# Patient Record
Sex: Male | Born: 1965 | Race: Black or African American | Hispanic: No | Smoking: Former smoker
Health system: Southern US, Community
[De-identification: ages and names within clinical notes are randomized; demographics above are authoritative.]

## PROBLEM LIST (undated history)

## (undated) DIAGNOSIS — I493 Ventricular premature depolarization: Secondary | ICD-10-CM

## (undated) DIAGNOSIS — B009 Herpesviral infection, unspecified: Secondary | ICD-10-CM

## (undated) DIAGNOSIS — F32A Depression, unspecified: Secondary | ICD-10-CM

## (undated) DIAGNOSIS — F419 Anxiety disorder, unspecified: Secondary | ICD-10-CM

## (undated) DIAGNOSIS — N529 Male erectile dysfunction, unspecified: Secondary | ICD-10-CM

## (undated) DIAGNOSIS — K219 Gastro-esophageal reflux disease without esophagitis: Secondary | ICD-10-CM

## (undated) DIAGNOSIS — F329 Major depressive disorder, single episode, unspecified: Secondary | ICD-10-CM

## (undated) DIAGNOSIS — R0683 Snoring: Secondary | ICD-10-CM

## (undated) HISTORY — DX: Depression, unspecified: F32.A

## (undated) HISTORY — PX: OTHER SURGICAL HISTORY: SHX169

## (undated) HISTORY — DX: Ventricular premature depolarization: I49.3

## (undated) HISTORY — DX: Male erectile dysfunction, unspecified: N52.9

## (undated) HISTORY — DX: Herpesviral infection, unspecified: B00.9

## (undated) HISTORY — DX: Gastro-esophageal reflux disease without esophagitis: K21.9

## (undated) HISTORY — DX: Snoring: R06.83

## (undated) HISTORY — DX: Anxiety disorder, unspecified: F41.9

## (undated) HISTORY — DX: Major depressive disorder, single episode, unspecified: F32.9

---

## 2001-09-13 ENCOUNTER — Encounter: Payer: Self-pay | Admitting: *Deleted

## 2001-09-13 ENCOUNTER — Emergency Department (HOSPITAL_COMMUNITY): Admission: EM | Admit: 2001-09-13 | Discharge: 2001-09-13 | Payer: Self-pay | Admitting: *Deleted

## 2001-11-28 ENCOUNTER — Ambulatory Visit (HOSPITAL_COMMUNITY): Admission: RE | Admit: 2001-11-28 | Discharge: 2001-11-28 | Payer: Self-pay | Admitting: Unknown Physician Specialty

## 2001-11-28 ENCOUNTER — Encounter: Payer: Self-pay | Admitting: Unknown Physician Specialty

## 2004-04-12 HISTORY — PX: PARTIAL COLECTOMY: SHX5273

## 2004-11-16 ENCOUNTER — Encounter: Admission: RE | Admit: 2004-11-16 | Discharge: 2004-11-16 | Payer: Self-pay

## 2004-11-17 ENCOUNTER — Inpatient Hospital Stay (HOSPITAL_COMMUNITY): Admission: EM | Admit: 2004-11-17 | Discharge: 2004-11-20 | Payer: Self-pay | Admitting: Emergency Medicine

## 2004-11-18 ENCOUNTER — Ambulatory Visit: Payer: Self-pay | Admitting: Cardiology

## 2005-01-27 ENCOUNTER — Encounter: Admission: RE | Admit: 2005-01-27 | Discharge: 2005-01-27 | Payer: Self-pay | Admitting: General Surgery

## 2007-01-19 DIAGNOSIS — N400 Enlarged prostate without lower urinary tract symptoms: Secondary | ICD-10-CM | POA: Insufficient documentation

## 2007-01-19 DIAGNOSIS — K5730 Diverticulosis of large intestine without perforation or abscess without bleeding: Secondary | ICD-10-CM | POA: Insufficient documentation

## 2007-01-19 DIAGNOSIS — Z8249 Family history of ischemic heart disease and other diseases of the circulatory system: Secondary | ICD-10-CM | POA: Insufficient documentation

## 2007-01-19 DIAGNOSIS — F419 Anxiety disorder, unspecified: Secondary | ICD-10-CM | POA: Insufficient documentation

## 2007-01-19 DIAGNOSIS — N529 Male erectile dysfunction, unspecified: Secondary | ICD-10-CM | POA: Insufficient documentation

## 2007-02-21 DIAGNOSIS — Z7251 High risk heterosexual behavior: Secondary | ICD-10-CM | POA: Insufficient documentation

## 2010-10-30 ENCOUNTER — Emergency Department: Payer: Self-pay | Admitting: Emergency Medicine

## 2013-12-16 ENCOUNTER — Emergency Department: Payer: Self-pay | Admitting: Emergency Medicine

## 2013-12-16 LAB — URINALYSIS, COMPLETE
BILIRUBIN, UR: NEGATIVE
Bacteria: NONE SEEN
Blood: NEGATIVE
Glucose,UR: NEGATIVE mg/dL (ref 0–75)
KETONE: NEGATIVE
Leukocyte Esterase: NEGATIVE
Nitrite: NEGATIVE
PH: 5 (ref 4.5–8.0)
Protein: NEGATIVE
RBC,UR: 3 /HPF (ref 0–5)
Specific Gravity: 1.023 (ref 1.003–1.030)

## 2013-12-16 LAB — CBC WITH DIFFERENTIAL/PLATELET
BASOS PCT: 0.4 %
Basophil #: 0 10*3/uL (ref 0.0–0.1)
EOS ABS: 0 10*3/uL (ref 0.0–0.7)
Eosinophil %: 0.3 %
HCT: 48.4 % (ref 40.0–52.0)
HGB: 15.2 g/dL (ref 13.0–18.0)
Lymphocyte #: 1.9 10*3/uL (ref 1.0–3.6)
Lymphocyte %: 16.7 %
MCH: 25.4 pg — AB (ref 26.0–34.0)
MCHC: 31.4 g/dL — ABNORMAL LOW (ref 32.0–36.0)
MCV: 81 fL (ref 80–100)
MONOS PCT: 11 %
Monocyte #: 1.3 x10 3/mm — ABNORMAL HIGH (ref 0.2–1.0)
NEUTROS ABS: 8.2 10*3/uL — AB (ref 1.4–6.5)
NEUTROS PCT: 71.6 %
PLATELETS: 159 10*3/uL (ref 150–440)
RBC: 5.98 10*6/uL — AB (ref 4.40–5.90)
RDW: 14.4 % (ref 11.5–14.5)
WBC: 11.5 10*3/uL — ABNORMAL HIGH (ref 3.8–10.6)

## 2013-12-16 LAB — COMPREHENSIVE METABOLIC PANEL
ALK PHOS: 76 U/L
AST: 23 U/L (ref 15–37)
Albumin: 3.9 g/dL (ref 3.4–5.0)
Anion Gap: 8 (ref 7–16)
BUN: 9 mg/dL (ref 7–18)
Bilirubin,Total: 0.9 mg/dL (ref 0.2–1.0)
CALCIUM: 8.8 mg/dL (ref 8.5–10.1)
CREATININE: 0.98 mg/dL (ref 0.60–1.30)
Chloride: 103 mmol/L (ref 98–107)
Co2: 26 mmol/L (ref 21–32)
EGFR (African American): >60
GLUCOSE: 125 mg/dL — AB (ref 65–99)
OSMOLALITY: 274 (ref 275–301)
POTASSIUM: 3.8 mmol/L (ref 3.5–5.1)
SGPT (ALT): 38 U/L
SODIUM: 137 mmol/L (ref 136–145)
TOTAL PROTEIN: 8.2 g/dL (ref 6.4–8.2)

## 2014-10-24 ENCOUNTER — Other Ambulatory Visit: Payer: Self-pay | Admitting: Family Medicine

## 2014-10-24 DIAGNOSIS — K219 Gastro-esophageal reflux disease without esophagitis: Secondary | ICD-10-CM

## 2014-10-24 DIAGNOSIS — B009 Herpesviral infection, unspecified: Secondary | ICD-10-CM | POA: Insufficient documentation

## 2014-10-24 NOTE — Telephone Encounter (Signed)
LAST CPE 03/06/13

## 2015-02-11 ENCOUNTER — Other Ambulatory Visit: Payer: Self-pay | Admitting: Family Medicine

## 2015-04-29 ENCOUNTER — Ambulatory Visit (INDEPENDENT_AMBULATORY_CARE_PROVIDER_SITE_OTHER): Payer: BLUE CROSS/BLUE SHIELD | Admitting: Family Medicine

## 2015-04-29 ENCOUNTER — Encounter: Payer: Self-pay | Admitting: Family Medicine

## 2015-04-29 VITALS — BP 102/62 | HR 88 | Temp 98.5°F | Resp 16 | Ht 73.5 in | Wt 214.0 lb

## 2015-04-29 DIAGNOSIS — B009 Herpesviral infection, unspecified: Secondary | ICD-10-CM | POA: Diagnosis not present

## 2015-04-29 DIAGNOSIS — K219 Gastro-esophageal reflux disease without esophagitis: Secondary | ICD-10-CM | POA: Insufficient documentation

## 2015-04-29 DIAGNOSIS — M7741 Metatarsalgia, right foot: Secondary | ICD-10-CM

## 2015-04-29 DIAGNOSIS — F32A Depression, unspecified: Secondary | ICD-10-CM | POA: Insufficient documentation

## 2015-04-29 DIAGNOSIS — R0683 Snoring: Secondary | ICD-10-CM | POA: Insufficient documentation

## 2015-04-29 DIAGNOSIS — I493 Ventricular premature depolarization: Secondary | ICD-10-CM | POA: Insufficient documentation

## 2015-04-29 DIAGNOSIS — F329 Major depressive disorder, single episode, unspecified: Secondary | ICD-10-CM | POA: Insufficient documentation

## 2015-04-29 MED ORDER — VALACYCLOVIR HCL 500 MG PO TABS: ORAL_TABLET | ORAL | Status: DC

## 2015-04-29 NOTE — Patient Instructions (Signed)
We will call you with the podiatry referral. 

## 2015-04-29 NOTE — Progress Notes (Signed)
Subjective:     Patient ID: RAMIL EDGINGTON, male   DOB: 05/13/1965, 50 y.o.   MRN: [id number]  HPI  Chief Complaint  Patient presents with  . Foot Pain  Originally scheduled for a physical but patient states he solely wishes evaluation of right foot pain  x 4 weeks with weight bearing. Localizes pain to fourth metatarsal. 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 He wears shoe inserts and has changed his foot wear on several occasions. Also wishes refill on Valtrex 500 mg. for HSV suppression. States he may only get one breakthrough eruption over the course of the year.    Review of Systems     Objective:   Physical Exam  Constitutional: He appears well-developed and well-nourished. No distress.  Musculoskeletal:  Right DF/PF 5/5. Ankle ligaments stable. Tender over midshaft fourth metatarsal.       Assessment:    1. Uncomplicated herpes simplex - valACYclovir (VALTREX) 500 MG tablet; One tablet daily  Dispense: 90 tablet; Refill: 3  2. Metatarsalgia, right - Ambulatory referral to Podiatry    Plan:    We will call him with referral information. Have printed out rx today as he is not sure of mail order pharmacy due to insurance change.

## 2015-06-07 IMAGING — CT CT ABD-PELV W/ CM
2 of 5 series · 16 of 46 positions shown, 18 images · IV contrast (isovue)
Comparison: 10/30/2010

CLINICAL DATA: Left lower quadrant pain for 2 days. History of
diverticulitis.

EXAM:
CT ABDOMEN AND PELVIS WITH CONTRAST
TECHNIQUE: Multidetector CT imaging of the abdomen and pelvis was performed
using the standard protocol following bolus administration of
intravenous contrast.
CONTRAST:  100 mL Isovue 370

[Series 2: routine abd pel with · axial · 0.71mm/px · z∈[-333,+132]mm · 13 of 105 slices shown, 15 images]
[im 6/105  soft-tissue]
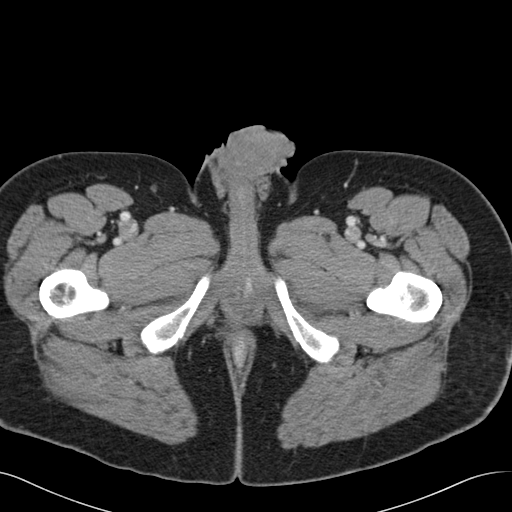
[im 6/105  bone]
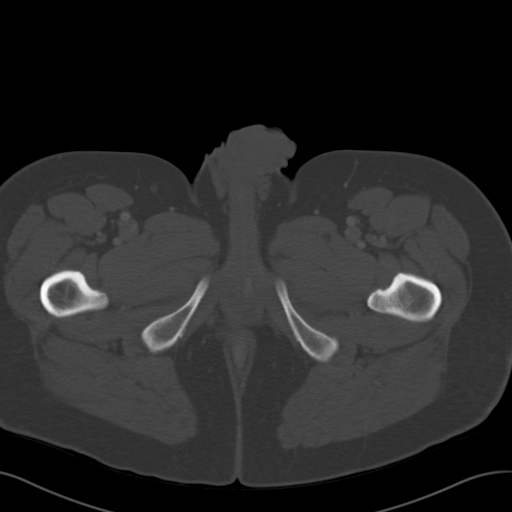
[im 16/105  soft-tissue]
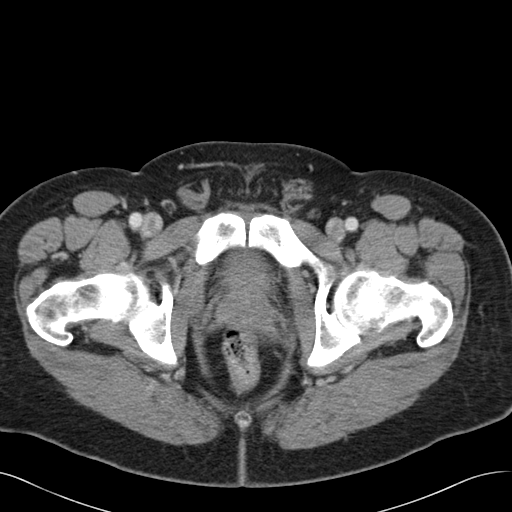
[im 21/105  soft-tissue]
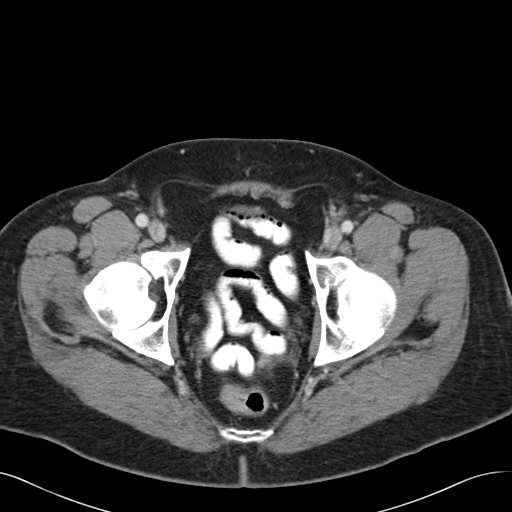
[im 32/105  soft-tissue]
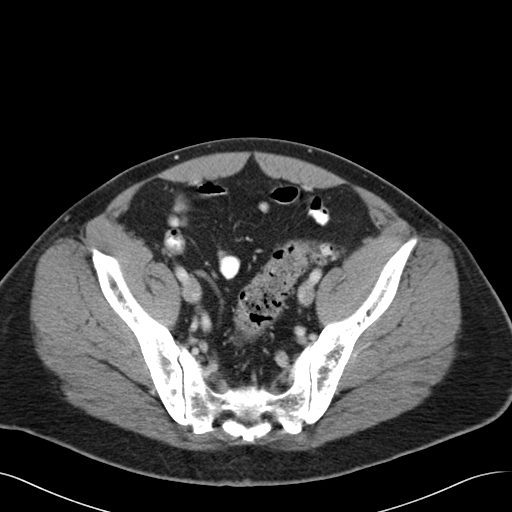
[im 37/105  soft-tissue]
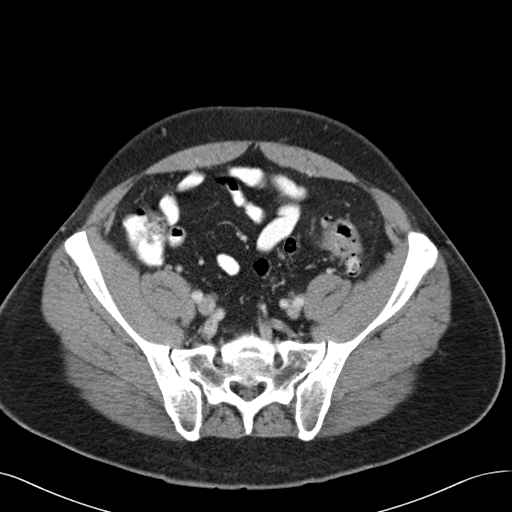
[im 47/105  soft-tissue]
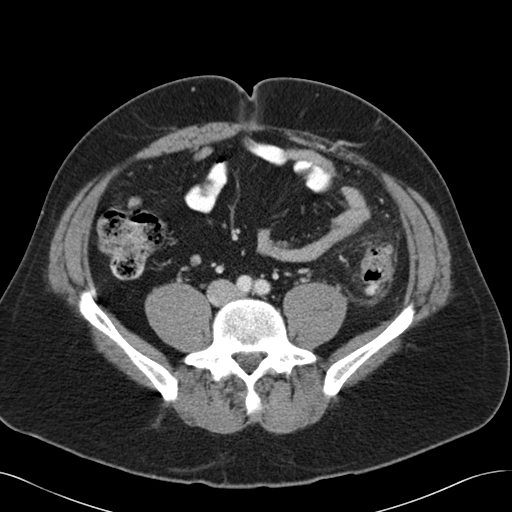
[im 53/105  soft-tissue]
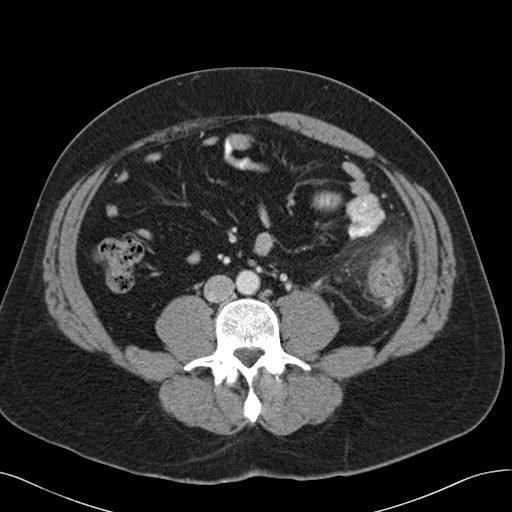
[im 58/105  soft-tissue]
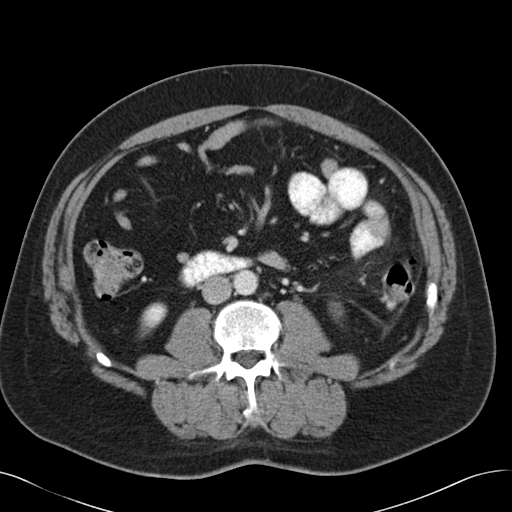
[im 68/105  soft-tissue]
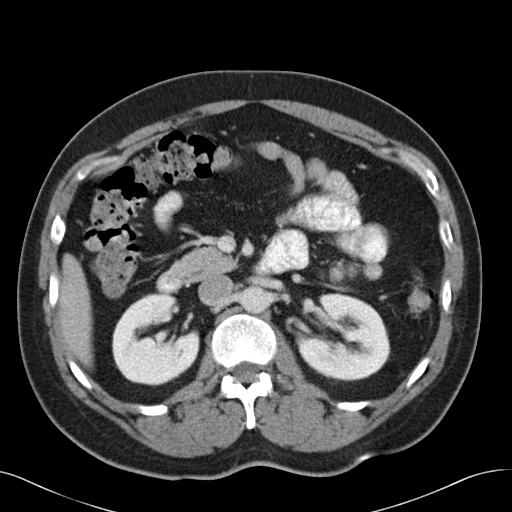
[im 68/105  bone]
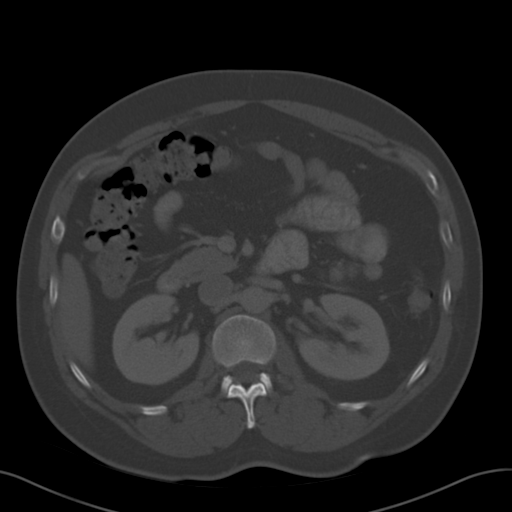
[im 73/105  soft-tissue]
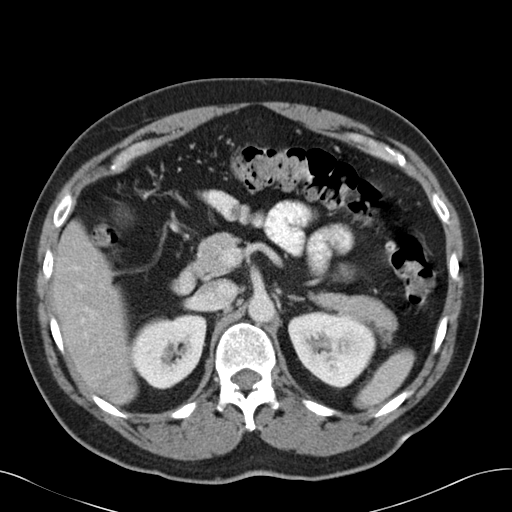
[im 84/105  soft-tissue]
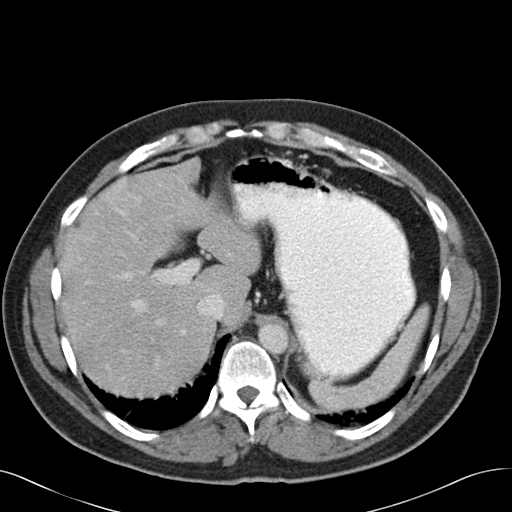
[im 89/105  soft-tissue]
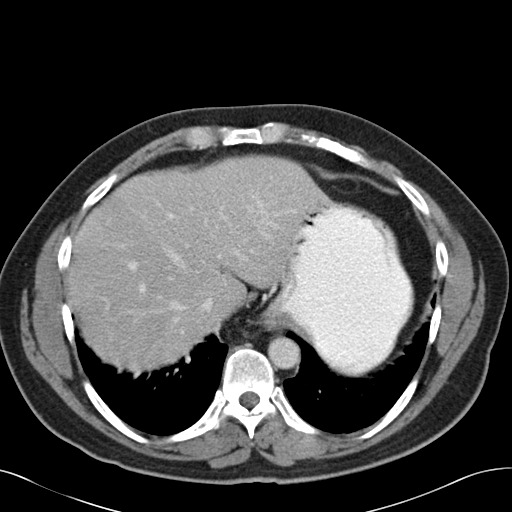
[im 99/105  soft-tissue]
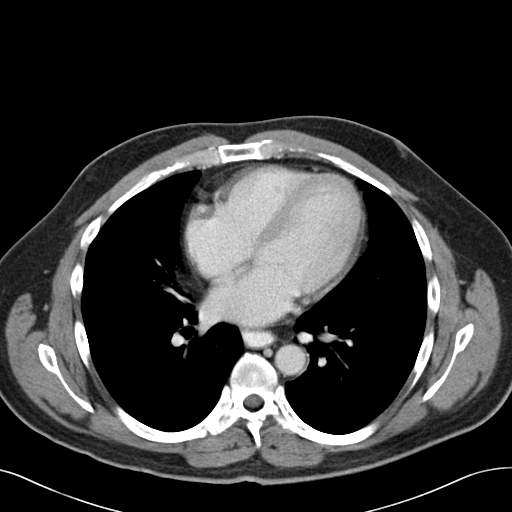

[Series 6: cor routine abd pel with · coronal · 0.71mm/px · 3 of 144 slices shown]
[im 48/144  soft-tissue]
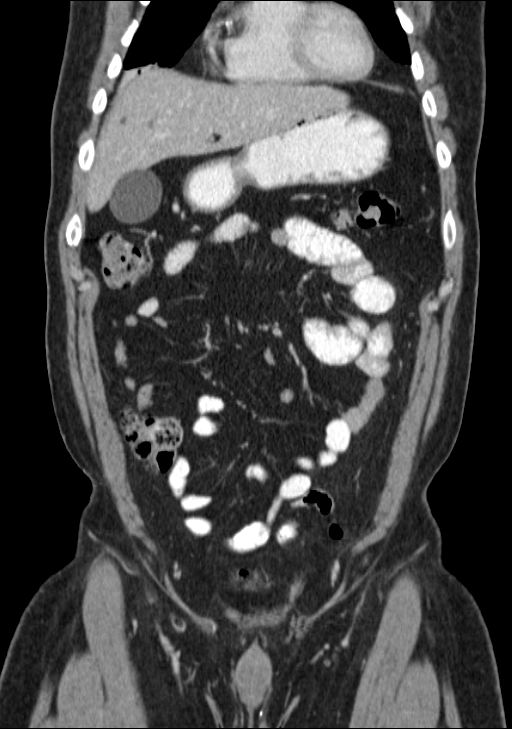
[im 64/144  soft-tissue]
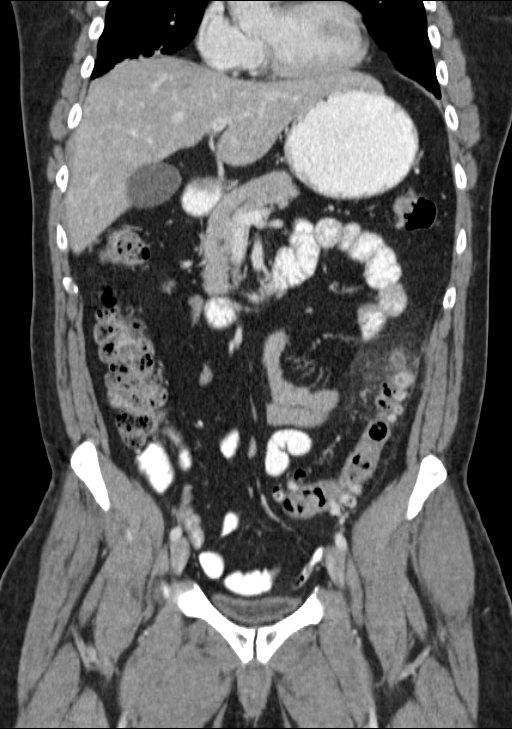
[im 80/144  soft-tissue]
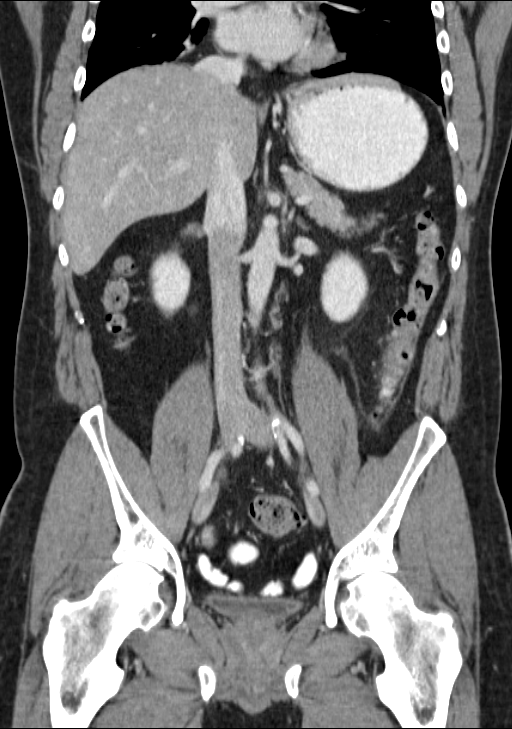

[16 of 46 positions shown; findings below may reference images not displayed]

FINDINGS: Linear atelectasis in the lung bases. Small esophageal hiatal
hernia. Residual contrast material in the lower esophagus may
indicate reflux or dysmotility.

Mild diffuse fatty infiltration of the liver. Scattered
circumscribed sub cm low-attenuation lesions, likely cysts.
Gallbladder, pancreas, spleen, adrenal glands, kidneys, abdominal
aorta, inferior vena cava, and retroperitoneal lymph nodes are
unremarkable. Stomach and small bowel are normal for degree of
distention. No free air or free fluid in the abdomen.

Pelvis: Diverticulosis of the descending and sigmoid colon.
Pericolonic infiltration around the mid descending colon consistent
with acute diverticulitis. No abscess. No free or loculated pelvic
fluid collections. Bladder is decompressed. Appendix is normal. No
destructive bone lesions.
IMPRESSION: Colonic diverticulosis with inflammatory changes in the mid
descending colon consistent with acute diverticulitis. No abscess.

## 2015-07-25 ENCOUNTER — Other Ambulatory Visit: Payer: Self-pay | Admitting: Family Medicine

## 2015-07-25 DIAGNOSIS — B009 Herpesviral infection, unspecified: Secondary | ICD-10-CM

## 2015-07-25 MED ORDER — VALACYCLOVIR HCL 500 MG PO TABS: ORAL_TABLET | ORAL | Status: DC

## 2017-02-18 ENCOUNTER — Encounter: Payer: Self-pay | Admitting: Family Medicine

## 2017-02-18 ENCOUNTER — Ambulatory Visit: Payer: 59 | Admitting: Family Medicine

## 2017-02-18 VITALS — BP 122/82 | HR 74 | Temp 98.7°F | Resp 16 | Ht 73.5 in | Wt 202.6 lb

## 2017-02-18 DIAGNOSIS — K219 Gastro-esophageal reflux disease without esophagitis: Secondary | ICD-10-CM

## 2017-02-18 DIAGNOSIS — B009 Herpesviral infection, unspecified: Secondary | ICD-10-CM | POA: Diagnosis not present

## 2017-02-18 MED ORDER — VALACYCLOVIR HCL 500 MG PO TABS: ORAL_TABLET | ORAL | 3 refills | Status: DC

## 2017-02-18 MED ORDER — VALACYCLOVIR HCL 500 MG PO TABS: ORAL_TABLET | ORAL | 0 refills | Status: DC

## 2017-02-18 MED ORDER — PANTOPRAZOLE SODIUM 40 MG PO TBEC: 40.0000 mg | DELAYED_RELEASE_TABLET | Freq: Every day | ORAL | 1 refills | Status: DC

## 2017-02-18 NOTE — Progress Notes (Signed)
Subjective:     Patient ID: Alexander Greer, male   DOB: 18-Nov-1965, 51 y.o.   MRN: [id number]  HPI  Chief Complaint  Patient presents with  . Gastroesophageal Reflux    Patient returns to office today for follow up visit, patient was last seen 02/08/13 and started on Protonix 40mg . Patient reports good compliance and symptom control on medication.   Marland Kitchen. Herpes Zoster    Patient request refill today for Valtrex, he states that he is currently having a outbreak.   States is currently have HSV outbreak but understands it is too late for acute treatment. Will refill his medication for prophylaxis purposes. Reports intermittent use of Protonix for flares of heartburn. 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 Defers health maintenance.   Review of Systems  Gastrointestinal:       Due for repeat colonoscopy. Will call for referral back to [facility]. 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       Objective:   Physical Exam  Constitutional: He appears well-developed and well-nourished. No distress.  Psychiatric: He has a normal mood and affect. His behavior is normal.       Assessment:    1. Uncomplicated herpes simplex: also sent rx for #90 with 3 refills to [facility]. - valACYclovir (VALTREX) 500 MG tablet; One tablet daily  Dispense: 30 tablet; Refill: 0  2. Gastroesophageal reflux disease without esophagitis: rx for Protonix 40 mg. #30 with refill for prn use. Plan:    He will call for G.I. Referral once established in his new job. Discussed H.O.B.elevation.

## 2017-02-18 NOTE — Patient Instructions (Signed)
Do call for colonoscopy referral when your schedule is established in your new job.

## 2017-03-08 ENCOUNTER — Telehealth: Payer: Self-pay | Admitting: Family Medicine

## 2017-03-08 ENCOUNTER — Other Ambulatory Visit: Payer: Self-pay | Admitting: Family Medicine

## 2017-03-08 MED ORDER — PANTOPRAZOLE SODIUM 40 MG PO TBEC: 40.0000 mg | DELAYED_RELEASE_TABLET | Freq: Every day | ORAL | 0 refills | Status: DC

## 2017-03-08 NOTE — Telephone Encounter (Signed)
[facility] faxed a request for the following medication. Thanks CC  pantoprazole (PROTONIX) 40 MG tablet

## 2017-03-08 NOTE — Telephone Encounter (Signed)
done

## 2017-03-08 NOTE — Telephone Encounter (Signed)
Please review

## 2017-03-09 ENCOUNTER — Telehealth: Payer: Self-pay | Admitting: Family Medicine

## 2017-03-09 NOTE — Telephone Encounter (Signed)
Pt contacted office for refill request on the following medications:  Rec'd a 2nd request.  pantoprazole (PROTONIX) 40 MG tablet   [facility] mail order/MW

## 2017-03-09 NOTE — Telephone Encounter (Signed)
Please see script on your desk. KW

## 2017-03-10 ENCOUNTER — Other Ambulatory Visit: Payer: Self-pay | Admitting: Family Medicine

## 2017-03-10 MED ORDER — PANTOPRAZOLE SODIUM 40 MG PO TBEC: 40.0000 mg | DELAYED_RELEASE_TABLET | Freq: Every day | ORAL | 0 refills | Status: DC

## 2017-03-10 NOTE — Telephone Encounter (Signed)
done

## 2017-05-06 ENCOUNTER — Other Ambulatory Visit: Payer: Self-pay | Admitting: Family Medicine

## 2017-09-01 ENCOUNTER — Other Ambulatory Visit: Payer: Self-pay | Admitting: Family Medicine

## 2017-09-01 ENCOUNTER — Telehealth: Payer: Self-pay | Admitting: Family Medicine

## 2017-09-01 DIAGNOSIS — B009 Herpesviral infection, unspecified: Secondary | ICD-10-CM

## 2017-09-01 MED ORDER — VALACYCLOVIR HCL 500 MG PO TABS: ORAL_TABLET | ORAL | 3 refills | Status: DC

## 2017-09-01 NOTE — Telephone Encounter (Signed)
Rx request for Valtrex 500 mg LOV: 02/18/2017 Last RF: 02/18/2017 No new OV at this time

## 2017-09-01 NOTE — Telephone Encounter (Signed)
done

## 2017-09-01 NOTE — Telephone Encounter (Signed)
[facility] faxed a refill request for the following medication. Thanks CC  valACYclovir (VALTREX) 500 MG tablet

## 2018-02-07 DIAGNOSIS — B360 Pityriasis versicolor: Secondary | ICD-10-CM | POA: Insufficient documentation

## 2018-03-20 LAB — HM COLONOSCOPY

## 2019-06-20 ENCOUNTER — Other Ambulatory Visit: Payer: Self-pay | Admitting: Adult Health

## 2019-06-20 ENCOUNTER — Other Ambulatory Visit: Payer: Self-pay

## 2019-06-20 ENCOUNTER — Encounter: Payer: Self-pay | Admitting: Adult Health

## 2019-06-20 ENCOUNTER — Ambulatory Visit (INDEPENDENT_AMBULATORY_CARE_PROVIDER_SITE_OTHER): Payer: 59 | Admitting: Adult Health

## 2019-06-20 VITALS — BP 118/82 | HR 82 | Temp 97.4°F | Resp 16 | Ht 73.0 in | Wt 205.2 lb

## 2019-06-20 DIAGNOSIS — K5730 Diverticulosis of large intestine without perforation or abscess without bleeding: Secondary | ICD-10-CM | POA: Diagnosis not present

## 2019-06-20 DIAGNOSIS — Z8719 Personal history of other diseases of the digestive system: Secondary | ICD-10-CM

## 2019-06-20 DIAGNOSIS — B009 Herpesviral infection, unspecified: Secondary | ICD-10-CM | POA: Diagnosis not present

## 2019-06-20 DIAGNOSIS — Z13220 Encounter for screening for lipoid disorders: Secondary | ICD-10-CM

## 2019-06-20 DIAGNOSIS — N529 Male erectile dysfunction, unspecified: Secondary | ICD-10-CM

## 2019-06-20 DIAGNOSIS — Z1211 Encounter for screening for malignant neoplasm of colon: Secondary | ICD-10-CM

## 2019-06-20 DIAGNOSIS — Z125 Encounter for screening for malignant neoplasm of prostate: Secondary | ICD-10-CM

## 2019-06-20 DIAGNOSIS — Z0000 Encounter for general adult medical examination without abnormal findings: Secondary | ICD-10-CM | POA: Diagnosis not present

## 2019-06-20 DIAGNOSIS — Z113 Encounter for screening for infections with a predominantly sexual mode of transmission: Secondary | ICD-10-CM

## 2019-06-20 DIAGNOSIS — Z1329 Encounter for screening for other suspected endocrine disorder: Secondary | ICD-10-CM

## 2019-06-20 DIAGNOSIS — K1321 Leukoplakia of oral mucosa, including tongue: Secondary | ICD-10-CM

## 2019-06-20 DIAGNOSIS — K219 Gastro-esophageal reflux disease without esophagitis: Secondary | ICD-10-CM | POA: Diagnosis not present

## 2019-06-20 DIAGNOSIS — E559 Vitamin D deficiency, unspecified: Secondary | ICD-10-CM

## 2019-06-20 MED ORDER — VALACYCLOVIR HCL 1 G PO TABS: 1000.0000 mg | ORAL_TABLET | Freq: Every day | ORAL | 0 refills | Status: DC

## 2019-06-20 MED ORDER — TADALAFIL 5 MG PO TABS: 5.0000 mg | ORAL_TABLET | ORAL | 0 refills | Status: DC | PRN

## 2019-06-20 MED ORDER — PANTOPRAZOLE SODIUM 40 MG PO TBEC: 40.0000 mg | DELAYED_RELEASE_TABLET | Freq: Every day | ORAL | 0 refills | Status: DC

## 2019-06-20 NOTE — Progress Notes (Addendum)
Patient: Alexander Greer Male    DOB: [birth year]   54 y.o.   MRN: [id number] Visit Date: 06/20/2019  Today's Provider: Jairo Ben, FNP   Chief Complaint  Patient presents with  . Establish Care   Subjective:     HPI Patient returns back to office today to establish care, patient reports that he is feeling well today and will need his current prescriptions refilled. He needs refill Valtrex.  He was a current patient of PA Anola Gurney - last seen in office on 02/18/2017.  He had herpes zoster at  age 39 was his first outbreak.he reports this occurred on his lower back. He has had Valtrex on hand in case he needed and he reports it is now expired and he requests a refill.  He has no current outbreaks. Last outbreak was 6 months ago. He had fever blister as a teenager and none since. Denies any genital or rectal  outbreaks.    He has some erectile dysfunction, he reports two years ago since his divorce started and he has 12 more months to go. He is able to get an erection at times and then is unable to maintain it for longer than 3 to 5 minutes. He denies any pain or lesions of his genitals. He reports using Viagra in past and getting a headache. He would like to try Cialis, symptoms and side effects were  discussed.   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 He has not taken an antidepressant.   He uses alcohol. He does not smoke tobacco. He smokes weed occasionally.   Denies any cardiac disease, myocardial infarctions or shortness of breath.   He reports he has had a " white tongue " for years and it never clears up. Will send to ENT for evaluation given time frame. He has used multiple prescription antifungals in liquid.   He also needs Protonix refilled for his GERD. He follows with Dr. Norma Fredrickson in gastroenterology at [facility].  Colonoscopy 2006. I do see colonoscopy in 2019 but message shows it was canceled and no results are available.  Provider  is unable to view any results. He has a history f GERD as above and also has history of colon diverticulitis in past. He denies any worsening issues and reports GERD is controlled by diet and medication. He drinks less now than he did when he was going through his divorce. He reports social drinking occasionally in the evenings. Denies any oral, rectal bleeding. Denies any abdominal pain.   Patient  denies any fever, body aches,chills, rash, chest pain, shortness of breath, nausea, vomiting, or diarrhea.     Allergies  Allergen Reactions  . Penicillins      Current Outpatient Medications:  .  Aloe Vera (ALOE VERA MOISTURIZING) 99.5 % GEL, Take by mouth., Disp: , Rfl:  .  pantoprazole (PROTONIX) 40 MG tablet, TAKE 1 TABLET BY MOUTH  DAILY AS NEEDED FOR ACID  REFLUX/HEARTBURN (Patient not taking: Reported on 06/20/2019), Disp: 90 tablet, Rfl: 0 .  valACYclovir (VALTREX) 500 MG tablet, One tablet daily (Patient not taking: Reported on 06/20/2019), Disp: 90 tablet, Rfl: 3  Review of Systems  Constitutional: Positive for fatigue. Negative for activity change, appetite change, chills, diaphoresis, fever and unexpected weight change.  HENT: Negative.   Respiratory: Negative.  Negative for apnea, cough, choking, chest tightness, shortness of breath, wheezing and stridor.   Cardiovascular: Negative.  Negative for chest pain,  palpitations and leg swelling.  Gastrointestinal: Negative.        GERD controlled. He reports regular bowel movements. Denies bleeding.   Genitourinary: Negative for decreased urine volume, difficulty urinating, discharge, dysuria, enuresis, flank pain, frequency, genital sores, hematuria, penile pain, penile swelling, scrotal swelling, testicular pain and urgency.       He reports difficulty maintaining an erection since his divorce. He has a new partner.   Musculoskeletal: Negative.   Skin: Negative.        Denies any rash or Zoster. Denies any skin lesions or change in skin.    Allergic/Immunologic: Positive for environmental allergies.  Neurological: Negative.   Psychiatric/Behavioral: Negative for agitation, behavioral problems, confusion, decreased concentration, dysphoric mood, hallucinations, self-injury, sleep disturbance and suicidal ideas. The patient is nervous/anxious. The patient is not hyperactive.   All other systems reviewed and are negative.   Social History   Tobacco Use  . Smoking status: Former Smoker    Types: Cigarettes  . Smokeless tobacco: Never Used  . Tobacco comment: Quit smoking in his 9s  Substance Use Topics  . Alcohol use: Yes    Alcohol/week: 6.0 standard drinks    Types: 6 Cans of beer per week      Objective:     Blood pressure 118/82, pulse 82, temperature (!) 97.4 F (36.3 C), temperature source Temporal, resp. rate 16, height 6\' 1"  (1.854 m), weight 205 lb 3.2 oz (93.1 kg), SpO2 98 %. Body mass index is 27.07 kg/m.   Physical Exam Vitals reviewed.  Constitutional:      General: He is not in acute distress.    Appearance: Normal appearance. He is not ill-appearing, toxic-appearing or diaphoretic.  HENT:     Head: Normocephalic and atraumatic.     Right Ear: Tympanic membrane, ear canal and external ear normal. There is no impacted cerumen.     Left Ear: Tympanic membrane, ear canal and external ear normal. There is no impacted cerumen.     Nose: Nose normal. No congestion or rhinorrhea.     Mouth/Throat:     Mouth: Mucous membranes are moist.     Pharynx: No oropharyngeal exudate or posterior oropharyngeal erythema.  Eyes:     General: No scleral icterus.       Right eye: No discharge.        Left eye: No discharge.     Extraocular Movements: Extraocular movements intact.     Conjunctiva/sclera: Conjunctivae normal.     Pupils: Pupils are equal, round, and reactive to light.  Neck:     Vascular: No carotid bruit.  Cardiovascular:     Rate and Rhythm: Normal rate and regular rhythm.     Pulses: Normal  pulses.     Heart sounds: Normal heart sounds. No murmur. No friction rub. No gallop.   Pulmonary:     Effort: Pulmonary effort is normal. No respiratory distress.     Breath sounds: Normal breath sounds. No stridor. No wheezing, rhonchi or rales.  Chest:     Chest wall: No tenderness.  Abdominal:     General: Abdomen is flat. Bowel sounds are normal. There is no distension.     Palpations: Abdomen is soft. There is no mass.     Tenderness: There is no abdominal tenderness. There is no right CVA tenderness, left CVA tenderness, guarding or rebound.     Hernia: No hernia is present.  Genitourinary:    Comments: Patient declined, rectal and genital exam.  Musculoskeletal:  General: No swelling, tenderness, deformity or signs of injury.     Cervical back: Normal range of motion and neck supple. No rigidity or tenderness.     Right lower leg: No edema.     Left lower leg: No edema.  Lymphadenopathy:     Cervical: No cervical adenopathy.  Skin:    General: Skin is warm and dry.     Capillary Refill: Capillary refill takes less than 2 seconds.     Coloration: Skin is not jaundiced or pale.     Findings: No bruising, erythema, lesion or rash.  Neurological:     General: No focal deficit present.     Mental Status: He is alert and oriented to person, place, and time.     Cranial Nerves: No cranial nerve deficit.     Sensory: No sensory deficit.     Motor: No weakness.     Coordination: Coordination normal.     Gait: Gait normal.     Deep Tendon Reflexes: Reflexes normal.  Psychiatric:        Mood and Affect: Mood normal.        Behavior: Behavior normal.        Thought Content: Thought content normal.        Judgment: Judgment normal.    Depression screen PHQ 2/9 06/20/2019  Decreased Interest 1  Down, Depressed, Hopeless 1  PHQ - 2 Score 2  Altered sleeping 1  Tired, decreased energy 1  Change in appetite 0  Feeling bad or failure about yourself  1  Trouble  concentrating 2  Moving slowly or fidgety/restless 0  Suicidal thoughts 0  PHQ-9 Score 7  Difficult doing work/chores Somewhat difficult       No results found for any visits on 06/20/19.     Assessment & Plan     Annual physical exam  ED (erectile dysfunction) of organic origin - Plan: CBC with Differential/Platelet, Comprehensive Metabolic Panel (CMET)  Gastroesophageal reflux disease, unspecified whether esophagitis present - Plan: Ambulatory referral to Gastroenterology  Uncomplicated herpes simplex - Plan: HSV(herpes simplex vrs) 1+2 ab-IgG, HIV antibody (with reflex)  Colon, diverticulosis - Plan: Ambulatory referral to Gastroenterology  Screening for thyroid disorder - Plan: TSH  Screening for cholesterol level - Plan: Lipid Panel w/o Chol/HDL Ratio  Screening for STD (sexually transmitted disease) - Plan: RPR, Hepatitis panel, acute  Vitamin D deficiency - Plan: VITAMIN D 25 Hydroxy (Vit-D Deficiency, Fractures)  Encounter for prostate cancer screening - Plan: PSA  Screening for malignant neoplasm of colon - Plan: Ambulatory referral to Gastroenterology  History of diverticulitis - Plan: Ambulatory referral to Gastroenterology  Leukoplakia of oral cavity - Plan: Ambulatory referral to ENT   Colonoscopy referral can not see results or year. Needs follow up for chronic diverticulitis history as well as GERD.     Meds ordered this encounter  Medications  . tadalafil (CIALIS) 5 MG tablet    Sig: Take 1 tablet (5 mg total) by mouth every other day as needed for erectile dysfunction (inform all healthcare providers of use).    Dispense:  10 tablet    Refill:  0  . valACYclovir (VALTREX) 1000 MG tablet    Sig: Take 1 tablet (1,000 mg total) by mouth daily. Start at first sign of symptom onset. Use for 5 days.    Dispense:  20 tablet    Refill:  0  . pantoprazole (PROTONIX) 40 MG tablet    Sig: Take 1 tablet (40 mg  total) by mouth daily.    Dispense:  90  tablet    Refill:  0  Discussed medications and use as prescribed above. Discussed possible underlying etiologies - cardiovascular, urological for erectile dysfunction, patient declines further work up or concerning symptoms. He would like to try Cialis, discussed use as prescribed only and not to use more than once every 48 hours. Emergency of erection that does not resolve and seek care. Inform all providers you are on this medication, do not take with cardiac disease or with any nitrates.   An After Visit Summary was printed and given to the patient.Patient verbalized understanding of all instructions given and denies any further questions at this time.  Return in about 3 months (around 09/20/2019), or if symptoms worsen or fail to improve, for at any time for any worsening symptoms, Go to Emergency room/ urgent care if worse.    The entirety of the information documented in the History of Present Illness, Review of Systems and Physical Exam were personally obtained by me. Portions of this information were initially documented by the  Certified Medical Assistant whose name is documented in Epic and reviewed by me for thoroughness and accuracy.  I have personally performed the exam and reviewed the chart and it is accurate to the best of my knowledge.  Museum/gallery conservator has been used and any errors in dictation or transcription are unintentional.   Eula Fried. Flinchum FNP-C  [facility]   Jairo Ben, FNP  [facility]

## 2019-06-20 NOTE — Patient Instructions (Signed)
Valacyclovir caplets What is this medicine? VALACYCLOVIR (val ay SYE kloe veer) is an antiviral medicine. It is used to treat or prevent infections caused by certain kinds of viruses. Examples of these infections include herpes and shingles. This medicine will not cure herpes. This medicine may be used for other purposes; ask your health care provider or pharmacist if you have questions. COMMON BRAND NAME(S): Valtrex What should I tell my health care provider before I take this medicine? They need to know if you have any of these conditions:  acquired immunodeficiency syndrome (AIDS)  any other condition that may weaken the immune system  bone marrow or kidney transplant  kidney disease  an unusual or allergic reaction to valacyclovir, acyclovir, ganciclovir, valganciclovir, other medicines, foods, dyes, or preservatives  pregnant or trying to get pregnant  breast-feeding How should I use this medicine? Take this medicine by mouth with a glass of water. Follow the directions on the prescription label. You can take this medicine with or without food. Take your doses at regular intervals. Do not take your medicine more often than directed. Finish the full course prescribed by your doctor or health care professional even if you think your condition is better. Do not stop taking except on the advice of your doctor or health care professional. Talk to your pediatrician regarding the use of this medicine in children. While this drug may be prescribed for children as young as 2 years for selected conditions, precautions do apply. Overdosage: If you think you have taken too much of this medicine contact a poison control center or emergency room at once. NOTE: This medicine is only for you. Do not share this medicine with others. What if I miss a dose? If you miss a dose, take it as soon as you can. If it is almost time for your next dose, take only that dose. Do not take double or extra  doses. What may interact with this medicine? Do not take this medicine with any of the following medications:  cidofovir This medicine may also interact with the following medications:  adefovir  amphotericin B  certain antibiotics like amikacin, gentamicin, tobramycin, vancomycin  cimetidine  cisplatin  colistin  cyclosporine  foscarnet  lithium  methotrexate  probenecid  tacrolimus This list may not describe all possible interactions. Give your health care provider a list of all the medicines, herbs, non-prescription drugs, or dietary supplements you use. Also tell them if you smoke, drink alcohol, or use illegal drugs. Some items may interact with your medicine. What should I watch for while using this medicine? Tell your doctor or health care professional if your symptoms do not start to get better after 1 week. This medicine works best when taken early in the course of an infection, within the first 59 hours. Begin treatment as soon as possible after the first signs of infection like tingling, itching, or pain in the affected area. It is possible that genital herpes may still be spread even when you are not having symptoms. Always use safer sex practices like condoms made of latex or polyurethane whenever you have sexual contact. You should stay well hydrated while taking this medicine. Drink plenty of fluids. What side effects may I notice from receiving this medicine? Side effects that you should report to your doctor or health care professional as soon as possible:  allergic reactions like skin rash, itching or hives, swelling of the face, lips, or tongue  aggressive behavior  confusion  hallucinations  problems  with balance, talking, walking  stomach pain  tremor  trouble passing urine or change in the amount of urine Side effects that usually do not require medical attention (report to your doctor or health care professional if they continue or are  bothersome):  dizziness  headache  nausea, vomiting This list may not describe all possible side effects. Call your doctor for medical advice about side effects. You may report side effects to FDA at 1-800-FDA-1088. Where should I keep my medicine? Keep out of the reach of children. Store at room temperature between 15 and 25 degrees C (59 and 77 degrees F). Keep container tightly closed. Throw away any unused medicine after the expiration date. NOTE: This sheet is a summary. It may not cover all possible information. If you have questions about this medicine, talk to your doctor, pharmacist, or health care provider.  2020 Elsevier/Gold Standard (2018-04-25 12:22:33) Gastroesophageal Reflux Disease, Adult Gastroesophageal reflux (GER) happens when acid from the stomach flows up into the tube that connects the mouth and the stomach (esophagus). Normally, food travels down the esophagus and stays in the stomach to be digested. With GER, food and stomach acid sometimes move back up into the esophagus. You may have a disease called gastroesophageal reflux disease (GERD) if the reflux:  Happens often.  Causes frequent or very bad symptoms.  Causes problems such as damage to the esophagus. When this happens, the esophagus becomes sore and swollen (inflamed). Over time, GERD can make small holes (ulcers) in the lining of the esophagus. What are the causes? This condition is caused by a problem with the muscle between the esophagus and the stomach. When this muscle is weak or not normal, it does not close properly to keep food and acid from coming back up from the stomach. The muscle can be weak because of:  Tobacco use.  Pregnancy.  Having a certain type of hernia (hiatal hernia).  Alcohol use.  Certain foods and drinks, such as coffee, chocolate, onions, and peppermint. What increases the risk? You are more likely to develop this condition if you:  Are overweight.  Have a disease  that affects your connective tissue.  Use NSAID medicines. What are the signs or symptoms? Symptoms of this condition include:  Heartburn.  Difficult or painful swallowing.  The feeling of having a lump in the throat.  A bitter taste in the mouth.  Bad breath.  Having a lot of saliva.  Having an upset or bloated stomach.  Belching.  Chest pain. Different conditions can cause chest pain. Make sure you see your doctor if you have chest pain.  Shortness of breath or noisy breathing (wheezing).  Ongoing (chronic) cough or a cough at night.  Wearing away of the surface of teeth (tooth enamel).  Weight loss. How is this treated? Treatment will depend on how bad your symptoms are. Your doctor may suggest:  Changes to your diet.  Medicine.  Surgery. Follow these instructions at home: Eating and drinking   Follow a diet as told by your doctor. You may need to avoid foods and drinks such as: ? Coffee and tea (with or without caffeine). ? Drinks that contain alcohol. ? Energy drinks and sports drinks. ? Bubbly (carbonated) drinks or sodas. ? Chocolate and cocoa. ? Peppermint and mint flavorings. ? Garlic and onions. ? Horseradish. ? Spicy and acidic foods. These include peppers, chili powder, curry powder, vinegar, hot sauces, and BBQ sauce. ? Citrus fruit juices and citrus fruits, such as oranges,  lemons, and limes. ? Tomato-based foods. These include red sauce, chili, salsa, and pizza with red sauce. ? Fried and fatty foods. These include donuts, french fries, potato chips, and high-fat dressings. ? High-fat meats. These include hot dogs, rib eye steak, sausage, ham, and bacon. ? High-fat dairy items, such as whole milk, butter, and cream cheese.  Eat small meals often. Avoid eating large meals.  Avoid drinking large amounts of liquid with your meals.  Avoid eating meals during the 2-3 hours before bedtime.  Avoid lying down right after you eat.  Do not  exercise right after you eat. Lifestyle   Do not use any products that contain nicotine or tobacco. These include cigarettes, e-cigarettes, and chewing tobacco. If you need help quitting, ask your doctor.  Try to lower your stress. If you need help doing this, ask your doctor.  If you are overweight, lose an amount of weight that is healthy for you. Ask your doctor about a safe weight loss goal. General instructions  Pay attention to any changes in your symptoms.  Take over-the-counter and prescription medicines only as told by your doctor. Do not take aspirin, ibuprofen, or other NSAIDs unless your doctor says it is okay.  Wear loose clothes. Do not wear anything tight around your waist.  Raise (elevate) the head of your bed about 6 inches (15 cm).  Avoid bending over if this makes your symptoms worse.  Keep all follow-up visits as told by your doctor. This is important. Contact a doctor if:  You have new symptoms.  You lose weight and you do not know why.  You have trouble swallowing or it hurts to swallow.  You have wheezing or a cough that keeps happening.  Your symptoms do not get better with treatment.  You have a hoarse voice. Get help right away if:  You have pain in your arms, neck, jaw, teeth, or back.  You feel sweaty, dizzy, or light-headed.  You have chest pain or shortness of breath.  You throw up (vomit) and your throw-up looks like blood or coffee grounds.  You pass out (faint).  Your poop (stool) is bloody or black.  You cannot swallow, drink, or eat. Summary  If a person has gastroesophageal reflux disease (GERD), food and stomach acid move back up into the esophagus and cause symptoms or problems such as damage to the esophagus.  Treatment will depend on how bad your symptoms are.  Follow a diet as told by your doctor.  Take all medicines only as told by your doctor. This information is not intended to replace advice given to you by your  health care provider. Make sure you discuss any questions you have with your health care provider. Document Revised: 10/05/2017 Document Reviewed: 10/05/2017 Elsevier Patient Education  2020 [employer]. Food Choices for Gastroesophageal Reflux Disease, Adult When you have gastroesophageal reflux disease (GERD), the foods you eat and your eating habits are very important. Choosing the right foods can help ease your discomfort. Think about working with a nutrition specialist (dietitian) to help you make good choices. What are tips for following this plan?  Meals  Choose healthy foods that are low in fat, such as fruits, vegetables, whole grains, low-fat dairy products, and lean meat, fish, and poultry.  Eat small meals often instead of 3 large meals a day. Eat your meals slowly, and in a place where you are relaxed. Avoid bending over or lying down until 2-3 hours after eating.  Avoid eating  meals 2-3 hours before bed.  Avoid drinking a lot of liquid with meals.  Cook foods using methods other than frying. Bake, grill, or broil food instead.  Avoid or limit: ? Chocolate. ? Peppermint or spearmint. ? Alcohol. ? Pepper. ? Black and decaffeinated coffee. ? Black and decaffeinated tea. ? Bubbly (carbonated) soft drinks. ? Caffeinated energy drinks and soft drinks.  Limit high-fat foods such as: ? Fatty meat or fried foods. ? Whole milk, cream, butter, or ice cream. ? Nuts and nut butters. ? Pastries, donuts, and sweets made with butter or shortening.  Avoid foods that cause symptoms. These foods may be different for everyone. Common foods that cause symptoms include: ? Tomatoes. ? Oranges, lemons, and limes. ? Peppers. ? Spicy food. ? Onions and garlic. ? Vinegar. Lifestyle  Maintain a healthy weight. Ask your doctor what weight is healthy for you. If you need to lose weight, work with your doctor to do so safely.  Exercise for at least 30 minutes for 5 or more days each  week, or as told by your doctor.  Wear loose-fitting clothes.  Do not smoke. If you need help quitting, ask your doctor.  Sleep with the head of your bed higher than your feet. Use a wedge under the mattress or blocks under the bed frame to raise the head of the bed. Summary  When you have gastroesophageal reflux disease (GERD), food and lifestyle choices are very important in easing your symptoms.  Eat small meals often instead of 3 large meals a day. Eat your meals slowly, and in a place where you are relaxed.  Limit high-fat foods such as fatty meat or fried foods.  Avoid bending over or lying down until 2-3 hours after eating.  Avoid peppermint and spearmint, caffeine, alcohol, and chocolate. This information is not intended to replace advice given to you by your health care provider. Make sure you discuss any questions you have with your health care provider. Document Revised: 07/20/2018 Document Reviewed: 05/04/2016 Elsevier Patient Education  2020 [employer] Inc[employer] Tadalafil tablets (Cialis) What is this medicine? TADALAFIL (tah DA la fil) is used to treat erection problems in men. It is also used for enlargement of the prostate gland in men, a condition called benign prostatic hyperplasia or BPH. This medicine improves urine flow and reduces BPH symptoms. This medicine can also treat both erection problems and BPH when they occur together. This medicine may be used for other purposes; ask your health care provider or pharmacist if you have questions. COMMON BRAND NAME(S): Mady Gemma, Cialis What should I tell my health care provider before I take this medicine? They need to know if you have any of these conditions:  bleeding disorders  eye or vision problems, including a rare inherited eye disease called retinitis pigmentosa  anatomical deformation of the penis, Peyronie's disease, or history of priapism (painful and prolonged erection)  heart disease, angina, a history of  heart attack, irregular heart beats, or other heart problems  high or low blood pressure  history of blood diseases, like sickle cell anemia or leukemia  history of stomach bleeding  kidney disease  liver disease  stroke  an unusual or allergic reaction to tadalafil, other medicines, foods, dyes, or preservatives  pregnant or trying to get pregnant  breast-feeding How should I use this medicine? Take this medicine by mouth with a glass of water. Follow the directions on the prescription label. You may take this medicine with or without meals. When this  medicine is used for erection problems, your doctor may prescribe it to be taken once daily or as needed. If you are taking the medicine as needed, you may be able to have sexual activity 30 minutes after taking it and for up to 36 hours after taking it. Whether you are taking the medicine as needed or once daily, you should not take more than one dose per day. If you are taking this medicine for symptoms of benign prostatic hyperplasia (BPH) or to treat both BPH and an erection problem, take the dose once daily at about the same time each day. Do not take your medicine more often than directed. Talk to your pediatrician regarding the use of this medicine in children. Special care may be needed. Overdosage: If you think you have taken too much of this medicine contact a poison control center or emergency room at once. NOTE: This medicine is only for you. Do not share this medicine with others. What if I miss a dose? If you are taking this medicine as needed for erection problems, this does not apply. If you miss a dose while taking this medicine once daily for an erection problem, benign prostatic hyperplasia, or both, take it as soon as you remember, but do not take more than one dose per day. What may interact with this medicine? Do not take this medicine with any of the following medications:  nitrates like amyl nitrite, isosorbide  dinitrate, isosorbide mononitrate, nitroglycerin  other medicines for erectile dysfunction like avanafil, sildenafil, vardenafil  other tadalafil products (Adcirca)  riociguat This medicine may also interact with the following medications:  certain drugs for high blood pressure  certain drugs for the treatment of HIV infection or AIDS  certain drugs used for fungal or yeast infections, like fluconazole, itraconazole, ketoconazole, and voriconazole  certain drugs used for seizures like carbamazepine, phenytoin, and phenobarbital  grapefruit juice  macrolide antibiotics like clarithromycin, erythromycin, troleandomycin  medicines for prostate problems  rifabutin, rifampin or rifapentine This list may not describe all possible interactions. Give your health care provider a list of all the medicines, herbs, non-prescription drugs, or dietary supplements you use. Also tell them if you smoke, drink alcohol, or use illegal drugs. Some items may interact with your medicine. What should I watch for while using this medicine? If you notice any changes in your vision while taking this drug, call your doctor or health care professional as soon as possible. Stop using this medicine and call your health care provider right away if you have a loss of sight in one or both eyes. Contact your doctor or health care professional right away if the erection lasts longer than 4 hours or if it becomes painful. This may be a sign of serious problem and must be treated right away to prevent permanent damage. If you experience symptoms of nausea, dizziness, chest pain or arm pain upon initiation of sexual activity after taking this medicine, you should refrain from further activity and call your doctor or health care professional as soon as possible. Do not drink alcohol to excess (examples, 5 glasses of wine or 5 shots of whiskey) when taking this medicine. When taken in excess, alcohol can increase your chances  of getting a headache or getting dizzy, increasing your heart rate or lowering your blood pressure. Using this medicine does not protect you or your partner against HIV infection (the virus that causes AIDS) or other sexually transmitted diseases. What side effects may I notice from receiving  this medicine? Side effects that you should report to your doctor or health care professional as soon as possible:  allergic reactions like skin rash, itching or hives, swelling of the face, lips, or tongue  breathing problems  changes in hearing  changes in vision  chest pain  fast, irregular heartbeat  prolonged or painful erection  seizures Side effects that usually do not require medical attention (report to your doctor or health care professional if they continue or are bothersome):  back pain  dizziness  flushing  headache  indigestion  muscle aches  nausea  stuffy or runny nose This list may not describe all possible side effects. Call your doctor for medical advice about side effects. You may report side effects to FDA at 1-800-FDA-1088. Where should I keep my medicine? Keep out of the reach of children. Store at room temperature between 15 and 30 degrees C (59 and 86 degrees F). Throw away any unused medicine after the expiration date. NOTE: This sheet is a summary. It may not cover all possible information. If you have questions about this medicine, talk to your doctor, pharmacist, or health care provider.  2020 Elsevier/Gold Standard (2013-08-17 13:15:49) Erectile Dysfunction Erectile dysfunction (ED) is the inability to get or keep an erection in order to have sexual intercourse. Erectile dysfunction may include:  Inability to get an erection.  Lack of enough hardness of the erection to allow penetration.  Loss of the erection before sex is finished. What are the causes? This condition may be caused by:  Certain medicines, such as: ? Pain  relievers. ? Antihistamines. ? Antidepressants. ? Blood pressure medicines. ? Water pills (diuretics). ? Ulcer medicines. ? Muscle relaxants. ? Drugs.  Excessive drinking.  Psychological causes, such as: ? Anxiety. ? Depression. ? Sadness. ? Exhaustion. ? Performance fear. ? Stress.  Physical causes, such as: ? Artery problems. This may include diabetes, smoking, liver disease, or atherosclerosis. ? High blood pressure. ? Hormonal problems, such as low testosterone. ? Obesity. ? Nerve problems. This may include back or pelvic injuries, diabetes mellitus, multiple sclerosis, or Parkinson disease. What are the signs or symptoms? Symptoms of this condition include:  Inability to get an erection.  Lack of enough hardness of the erection to allow penetration.  Loss of the erection before sex is finished.  Normal erections at some times, but with frequent unsatisfactory episodes.  Low sexual satisfaction in either partner due to erection problems.  A curved penis occurring with erection. The curve may cause pain or the penis may be too curved to allow for intercourse.  Never having nighttime erections. How is this diagnosed? This condition is often diagnosed by:  Performing a physical exam to find other diseases or specific problems with the penis.  Asking you detailed questions about the problem.  Performing blood tests to check for diabetes mellitus or to measure hormone levels.  Performing other tests to check for underlying health conditions.  Performing an ultrasound exam to check for scarring.  Performing a test to check blood flow to the penis.  Doing a sleep study at home to measure nighttime erections. How is this treated? This condition may be treated by:  Medicine taken by mouth to help you achieve an erection (oral medicine).  Hormone replacement therapy to replace low testosterone levels.  Medicine that is injected into the penis. Your health  care provider may instruct you how to give yourself these injections at home.  Vacuum pump. This is a pump with a  ring on it. The pump and ring are placed on the penis and used to create pressure that helps the penis become erect.  Penile implant surgery. In this procedure, you may receive: ? An inflatable implant. This consists of cylinders, a pump, and a reservoir. The cylinders can be inflated with a fluid that helps to create an erection, and they can be deflated after intercourse. ? A semi-rigid implant. This consists of two silicone rubber rods. The rods provide some rigidity. They are also flexible, so the penis can both curve downward in its normal position and become straight for sexual intercourse.  Blood vessel surgery, to improve blood flow to the penis. During this procedure, a blood vessel from a different part of the body is placed into the penis to allow blood to flow around (bypass) damaged or blocked blood vessels.  Lifestyle changes, such as exercising more, losing weight, and quitting smoking. Follow these instructions at home: Medicines   Take over-the-counter and prescription medicines only as told by your health care provider. Do not increase the dosage without first discussing it with your health care provider.  If you are using self-injections, perform injections as directed by your health care provider. Make sure to avoid any veins that are on the surface of the penis. After giving an injection, apply pressure to the injection site for 5 minutes. General instructions  Exercise regularly, as directed by your health care provider. Work with your health care provider to lose weight, if needed.  Do not use any products that contain nicotine or tobacco, such as cigarettes and e-cigarettes. If you need help quitting, ask your health care provider.  Before using a vacuum pump, read the instructions that come with the pump and discuss any questions with your health care  provider.  Keep all follow-up visits as told by your health care provider. This is important. Contact a health care provider if:  You feel nauseous.  You vomit. Get help right away if:  You are taking oral or injectable medicines and you have an erection that lasts longer than 4 hours. If your health care provider is unavailable, go to the nearest emergency room for evaluation. An erection that lasts much longer than 4 hours can result in permanent damage to your penis.  You have severe pain in your groin or abdomen.  You develop redness or severe swelling of your penis.  You have redness spreading up into your groin or lower abdomen.  You are unable to urinate.  You experience chest pain or a rapid heart beat (palpitations) after taking oral medicines. Summary  Erectile dysfunction (ED) is the inability to get or keep an erection during sexual intercourse. This problem can usually be treated successfully.  This condition is diagnosed based on a physical exam, your symptoms, and tests to determine the cause. Treatment varies depending on the cause, and may include medicines, hormone therapy, surgery, or vacuum pump.  You may need follow-up visits to make sure that you are using your medicines or devices correctly.  Get help right away if you are taking or injecting medicines and you have an erection that lasts longer than 4 hours. This information is not intended to replace advice given to you by your health care provider. Make sure you discuss any questions you have with your health care provider. Document Revised: 03/11/2017 Document Reviewed: 04/14/2016 Elsevier Patient Education  2020 [employer].

## 2019-06-21 LAB — LIPID PANEL W/O CHOL/HDL RATIO
Cholesterol, Total: 228 mg/dL — ABNORMAL HIGH (ref 100–199)
HDL: 50 mg/dL (ref >39)
LDL Chol Calc (NIH): 161 mg/dL — ABNORMAL HIGH (ref 0–99)
Triglycerides: 98 mg/dL (ref 0–149)
VLDL Cholesterol Cal: 17 mg/dL (ref 5–40)

## 2019-06-21 LAB — COMPREHENSIVE METABOLIC PANEL
ALT: 20 IU/L (ref 0–44)
AST: 18 IU/L (ref 0–40)
Albumin/Globulin Ratio: 1.6 (ref 1.2–2.2)
Albumin: 4.4 g/dL (ref 3.8–4.9)
Alkaline Phosphatase: 76 IU/L (ref 39–117)
BUN/Creatinine Ratio: 14 (ref 9–20)
BUN: 12 mg/dL (ref 6–24)
Bilirubin Total: 0.7 mg/dL (ref 0.0–1.2)
CO2: 22 mmol/L (ref 20–29)
Calcium: 9.7 mg/dL (ref 8.7–10.2)
Chloride: 102 mmol/L (ref 96–106)
Creatinine, Ser: 0.87 mg/dL (ref 0.76–1.27)
GFR calc Af Amer: 115 mL/min/{1.73_m2} (ref >59)
GFR calc non Af Amer: 99 mL/min/{1.73_m2} (ref >59)
Globulin, Total: 2.7 g/dL (ref 1.5–4.5)
Glucose: 105 mg/dL — ABNORMAL HIGH (ref 65–99)
Potassium: 4.5 mmol/L (ref 3.5–5.2)
Sodium: 139 mmol/L (ref 134–144)
Total Protein: 7.1 g/dL (ref 6.0–8.5)

## 2019-06-21 LAB — CBC WITH DIFFERENTIAL/PLATELET
Basophils Absolute: 0 10*3/uL (ref 0.0–0.2)
Basos: 1 %
EOS (ABSOLUTE): 0.1 10*3/uL (ref 0.0–0.4)
Eos: 2 %
Hematocrit: 47.5 % (ref 37.5–51.0)
Hemoglobin: 15.1 g/dL (ref 13.0–17.7)
Immature Grans (Abs): 0 10*3/uL (ref 0.0–0.1)
Immature Granulocytes: 0 %
Lymphocytes Absolute: 1.9 10*3/uL (ref 0.7–3.1)
Lymphs: 40 %
MCH: 25.2 pg — ABNORMAL LOW (ref 26.6–33.0)
MCHC: 31.8 g/dL (ref 31.5–35.7)
MCV: 79 fL (ref 79–97)
Monocytes Absolute: 0.5 10*3/uL (ref 0.1–0.9)
Monocytes: 11 %
Neutrophils Absolute: 2.2 10*3/uL (ref 1.4–7.0)
Neutrophils: 46 %
Platelets: 200 10*3/uL (ref 150–450)
RBC: 6 x10E6/uL — ABNORMAL HIGH (ref 4.14–5.80)
RDW: 14.5 % (ref 11.6–15.4)
WBC: 4.8 10*3/uL (ref 3.4–10.8)

## 2019-06-21 LAB — HEPATITIS PANEL, ACUTE
Hep A IgM: NEGATIVE
Hep B C IgM: NEGATIVE
Hep C Virus Ab: <0.1 s/co ratio (ref 0.0–0.9)
Hepatitis B Surface Ag: NEGATIVE

## 2019-06-21 LAB — HIV ANTIBODY (ROUTINE TESTING W REFLEX): HIV Screen 4th Generation wRfx: NONREACTIVE

## 2019-06-21 LAB — HSV(HERPES SIMPLEX VRS) I + II AB-IGG
HSV 1 Glycoprotein G Ab, IgG: <0.91 index (ref 0.00–0.90)
HSV 2 IgG, Type Spec: 16.2 index — ABNORMAL HIGH (ref 0.00–0.90)

## 2019-06-21 LAB — PSA: Prostate Specific Ag, Serum: 1.4 ng/mL (ref 0.0–4.0)

## 2019-06-21 LAB — TSH: TSH: 1.4 u[IU]/mL (ref 0.450–4.500)

## 2019-06-21 LAB — VITAMIN D 25 HYDROXY (VIT D DEFICIENCY, FRACTURES): Vit D, 25-Hydroxy: 11.4 ng/mL — ABNORMAL LOW (ref 30.0–100.0)

## 2019-06-21 LAB — RPR: RPR Ser Ql: NONREACTIVE

## 2019-06-22 ENCOUNTER — Encounter: Payer: Self-pay | Admitting: Adult Health

## 2019-06-22 ENCOUNTER — Other Ambulatory Visit: Payer: Self-pay | Admitting: Adult Health

## 2019-06-22 DIAGNOSIS — R7301 Impaired fasting glucose: Secondary | ICD-10-CM

## 2019-06-22 DIAGNOSIS — R718 Other abnormality of red blood cells: Secondary | ICD-10-CM

## 2019-06-22 DIAGNOSIS — E559 Vitamin D deficiency, unspecified: Secondary | ICD-10-CM

## 2019-06-22 DIAGNOSIS — E785 Hyperlipidemia, unspecified: Secondary | ICD-10-CM

## 2019-06-22 DIAGNOSIS — K1321 Leukoplakia of oral mucosa, including tongue: Secondary | ICD-10-CM | POA: Insufficient documentation

## 2019-06-22 MED ORDER — FLUCONAZOLE 150 MG PO TABS: 150.0000 mg | ORAL_TABLET | ORAL | 0 refills | Status: DC

## 2019-06-22 MED ORDER — VITAMIN D (ERGOCALCIFEROL) 1.25 MG (50000 UNIT) PO CAPS: 50000.0000 [IU] | ORAL_CAPSULE | ORAL | 0 refills | Status: DC

## 2019-06-22 NOTE — Progress Notes (Signed)
1.CMP mild increase in glucose at 105 if he was fasting. He should monitor his diet and avoid foods high in sugar, sugary beverages and processed/ starchy carbohydrates such as white bread, bagels, tortillas, cakes, muffins, and other baked goods containing white flour. white rice. white pasta. cereals, crackers, and pretzels that contain added sugar.  2. CBC mild increased in red blood cells, likely normal variant. Recheck in 4 months fasting. Thyroid is within normal limits.  3.Total cholesterol and LDL elevated.  Discuss lifestyle modification with patient e.g. increase exercise, fiber, fruits, vegetables, lean meat, and omega 3/fish intake and decrease saturated fat.   4.Acute Hepatitis A, B and C is negative.  5.HSV 1 is negative. HSV 2 is positive antibodies meaning he has likely had  genital herpes infection in past.  6. Syphilis and HIV negative. Repeat per CDC guidelines if exposure concern or need.  7.Vitamin D is very low at 11.4 Recommend prescription vitamin D at 50,000 international units once by mouth every 7 days( ONCE weekly)  for 12 weeks and then starting over the counter Vitamin D 3 at 4,000 international units once daily by mouth,  Repeat CBC,CMP,  cholesterol, and Vitamin D levels  around  4 months fasting.  I will also send a MYCHART message about cholesterol and diet.  Results to [facility].

## 2019-06-22 NOTE — Progress Notes (Signed)
PSA for prostate was also normal-

## 2019-06-22 NOTE — Addendum Note (Signed)
Addended by: Berniece Pap on: 06/22/2019 05:01 PM   Modules accepted: Orders

## 2019-06-22 NOTE — Progress Notes (Signed)
Meds ordered this encounter  Medications  . Vitamin D, Ergocalciferol, (DRISDOL) 1.25 MG (50000 UNIT) CAPS capsule    Sig: Take 1 capsule (50,000 Units total) by mouth every 7 (seven) days. (ONCE weekly)    Dispense:  12 capsule    Refill:  0   Recheck level in 4 months with CBC, CMP and lipid panel.   Orders Placed This Encounter  Procedures  . CBC with Differential/Platelet  . Comprehensive metabolic panel    Standing Status:   Future    Standing Expiration Date:   07/23/2019  . VITAMIN D 25 Hydroxy (Vit-D Deficiency, Fractures)  . Lipid Panel w/o Chol/HDL Ratio   I have already ordered this as a future order in the computer.

## 2019-06-25 ENCOUNTER — Other Ambulatory Visit: Payer: Self-pay | Admitting: Adult Health

## 2019-06-25 MED ORDER — VALACYCLOVIR HCL 1 G PO TABS: 1000.0000 mg | ORAL_TABLET | Freq: Every day | ORAL | 0 refills | Status: DC

## 2019-06-28 ENCOUNTER — Encounter: Payer: Self-pay | Admitting: Adult Health

## 2019-10-01 ENCOUNTER — Telehealth: Payer: Self-pay | Admitting: Adult Health

## 2019-10-01 DIAGNOSIS — Z719 Counseling, unspecified: Secondary | ICD-10-CM

## 2019-10-01 NOTE — Telephone Encounter (Signed)
Patient called to ask if he can get a referral for life counseling.  He was not sure if he needed to see the doctor, but he said the person he wants to see, Salem Callas [phone]), said that he would need a referral.  Please advise and call patient to let him know at [phone]

## 2019-10-01 NOTE — Telephone Encounter (Signed)
Okay to place referral

## 2019-10-01 NOTE — Telephone Encounter (Signed)
Lov 06/20/2019, please advise if okay to put in order.  KW

## 2019-10-09 ENCOUNTER — Other Ambulatory Visit: Payer: Self-pay | Admitting: Adult Health

## 2019-10-10 MED ORDER — TADALAFIL 5 MG PO TABS: 5.0000 mg | ORAL_TABLET | ORAL | 0 refills | Status: DC | PRN

## 2019-10-10 MED ORDER — VITAMIN D (ERGOCALCIFEROL) 1.25 MG (50000 UNIT) PO CAPS: 50000.0000 [IU] | ORAL_CAPSULE | ORAL | 0 refills | Status: DC

## 2019-10-16 ENCOUNTER — Telehealth (INDEPENDENT_AMBULATORY_CARE_PROVIDER_SITE_OTHER): Payer: 59 | Admitting: Licensed Clinical Social Worker

## 2019-10-16 ENCOUNTER — Encounter: Payer: Self-pay | Admitting: Licensed Clinical Social Worker

## 2019-10-16 ENCOUNTER — Other Ambulatory Visit: Payer: Self-pay

## 2019-10-16 DIAGNOSIS — F341 Dysthymic disorder: Secondary | ICD-10-CM | POA: Diagnosis not present

## 2019-10-16 NOTE — Progress Notes (Signed)
Patient Location: Home  Provider Location: Home Office   Virtual Visit via Video Note  I connected with Alexander Greer on 10/16/19 at  1:00 PM EDT by a video enabled telemedicine application and verified that I am speaking with the correct person using two identifiers.   I discussed the limitations of evaluation and management by telemedicine and the availability of in person appointments. The patient expressed understanding and agreed to proceed.  Comprehensive Clinical Assessment (CCA) Note  10/16/2019 Alexander Greer [id number]   Visit Diagnosis:   Dysthymia   CCA Screening, Triage and Referral (STR) STR has been completed on paper by the patient.  (See scanned document in Chart Review)   CCA Biopsychosocial  Intake/Chief Complaint:     Pt presents as a 54 year old, African-American, divorced male for assessment. Pt was referred by his girlfriend and is seeking counseling for depression. Pt reported I don't think I have ever really been happy the 54 years I have been on this earth. I don't have needs or desires. I don't wake up excited to do anything. No recent trauma, but I did just get my divorce. I was unhappy before that. I would like to discover who I am".   Pt reported "a little individual counseling around my marriage. The only thing helpful was that my counselor made me feel comfortable about my mindset about certain things. My beliefs and thoughts were completely different" from his ex-wife.  Mental Health Symptoms Depression:   fatigue, difficulty concentrating, wish I could cry more, sensitivity, no SI, lack of motivation, no sense of purpose or meaning, patient described living his life in the third person and not being an active participant in his own life.  Mania:   n/a  Anxiety:    tension, worrying about money and work, my time coming to an end without purpose  Psychosis:   n/a  Trauma:   n/a  Obsessions:   I hate odd numbers  Compulsions:   everything has to be  even numbers - doesn't control my life but is an awareness, color coordinated, organized  Inattention:   difficulty concentrating, short attention  Hyperactivity/Impulsivity:   n/a  Oppositional/Defiant Behaviors:   None. Pt reported "I more get upset with myself" when angry.  Emotional Irregularity:    None  Other Mood/Personality Symptoms:      Mental Status Exam Appearance and self-care  Stature:   average  Weight:   average  Clothing:   neat  Grooming:   normal  Cosmetic use:   none  Posture/gait:   normal  Motor activity:   not remarkable  Sensorium  Attention:   normal  Concentration:   normal  Orientation:   x5  Recall/memory:   normal  Affect and Mood  Affect:   appropriate  Mood:   depressed  Relating  Eye contact:   normal  Facial expression:   responsive, flat  Attitude toward examiner:   cooperative  Thought and Language  Speech flow:  Normal  Thought content:   Appropriate to circumstance  Preoccupation:   None  Hallucinations:   None  Organization:   Logical, Intact  Executive [employer] of Knowledge:    Good  Intelligence:   Above average  Abstraction:   Normal  Judgement:   Fair  Reality Testing:   Adequate  Insight:   Flashes of insight, gaps  Decision Making:   Responsible  Social Functioning  Social Maturity:   Responsible  Social Judgement:   Normal  Stress  Stressors:   having no purpose, sick of working -7 days per week since I was a teen  Coping Ability:   sleeping  Skill Deficits:   no coping skills, don't feel confident  Supports:   Pt reported "I got great family, but my parents aren't support type people, don't get involved. Dating someone who talked me into going back to therapy".      Religion: No, "but I believe there is a God".    Leisure/Recreation:  No  Exercise/Diet:  No, but "my partner walks and started walking with her, but not self-motivated to do it by myself. We go walking daily when together".  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   CCA Family/Childhood History  Family and Relationship History:  Divorced Pt reported "I have no biological children".  Childhood History:   Pt reported "I think I had good parents and a good childhood. My mom is an Programmer, systems, but can't recall her ever helping me with my homework. I am super intelligent and didn't need much help though". Pt reported that his father worked hard his whole life, was not formally educated, and taught himself to read. Pt described father as strict and "old school" in parenting. "if you didn't finish your chores, you didn't eat. I didn't think he was mean" and pt respected the rules. Pt explained him and his siblings "felt love, but nobody except my grandmother said I love you. We got presents at Christmas, but no other times. No physical affection until my grandmother moved in the home at age 40". Pt reported he is the opposite with his partner and is "super duper" affectionate.  When patient got in trouble he was whooped with a belt. Denied experiencing physical abuse or neglect. No sexual abuse. Pt reported "lived a very upper middle class lifestyle".    CCA Substance Use  Alcohol/Drug Use:    Pt reported "I drink very socially, occasionally" and will usually go home after work to have one beer or two. Pt reported "I am a CBD nut" and occasionally uses marijuana. Pt reported when he was young he tried all other substances and concluded that it was "not my bag and moved on". Pt denied having a problem with alcohol or drugs currently or in the past.  Alcohol First tried at age 75 or 59. Pt reported his parents were "old school" and would give him alcohol to get him to sleep. Pt usually drinks about one or two beers several times per week. Pt reported in the past "I used to work in the [employer] and drank by the case".  Pt last used alcohol over the weekend with family. Pt drank a 6 pack of beer and 2 mixed drinks.  Marijuana First tried at age 19. Pt reported he used to only use marijuana on his birthday every year and has tried [place] with some more to try to get a high here recently.   Recommendations for Services/Supports/Treatments:  Individual Therapy  DSM5 Diagnoses: Patient Active Problem List   Diagnosis Date Noted  . 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 History of diverticulitis 06/20/2019  . Screening for thyroid disorder 06/20/2019  . Vitamin D deficiency 06/20/2019  . Tinea versicolor 02/07/2018  . Gastroesophageal reflux disease 04/29/2015  . Uncomplicated herpes simplex 10/24/2014  . Colon, diverticulosis 01/19/2007  . Family history of cardiovascular disease 01/19/2007  .  Hyperplasia, prostate 01/19/2007  . 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 The patient was provided an opportunity to ask questions and all were answered. The patient agreed with the plan and demonstrated an understanding of the instructions.   The patient was advised to call back or seek an in-person evaluation if the symptoms worsen or if the condition fails to improve as anticipated.  I provided 30 minutes of non-face-to-face time during this encounter.  Alexander Jaquith Arnette Felts, LCSW, LCAS

## 2019-10-25 ENCOUNTER — Encounter: Payer: Self-pay | Admitting: Licensed Clinical Social Worker

## 2019-10-25 ENCOUNTER — Other Ambulatory Visit: Payer: Self-pay

## 2019-10-25 ENCOUNTER — Ambulatory Visit (INDEPENDENT_AMBULATORY_CARE_PROVIDER_SITE_OTHER): Payer: 59 | Admitting: Licensed Clinical Social Worker

## 2019-10-25 DIAGNOSIS — F341 Dysthymic disorder: Secondary | ICD-10-CM | POA: Diagnosis not present

## 2019-10-25 NOTE — Progress Notes (Signed)
Patient Location: Home  Provider Location: Home Office   Virtual Visit via Video Note  I connected with Alexander Greer on 10/25/19 at  1:00 PM EDT by a video enabled telemedicine application and verified that I am speaking with the correct person using two identifiers.   I discussed the limitations of evaluation and management by telemedicine and the availability of in person appointments. The patient expressed understanding and agreed to proceed.  THERAPY PROGRESS NOTE  Session Time: 55 Minutes  Participation Level: Active  Behavioral Response: Well GroomedAlertDysphoric  Type of Therapy: Individual Therapy  Treatment Goals addressed: Communication: Relationships and Coping  Interventions: CBT  Summary: Alexander Greer is a 54 y.o. male who presents with dysthymia. Pt reported that it has been a "not so good" week because he is still dealing with the dissolution of his marriage and is awaiting last court hearing to finalize the divorce. Pt reported that while he is grateful for his newly found freedom from the relationship, he is questioning what to do with his life and relationships moving forward. Pt described inner feelings, thoughts and beliefs about self and the world around him. Pt was able to articulate an interest in helping inspire others, yet not sure how to motivate himself toward action and feel joyful.  Suicidal/Homicidal: No  Therapist Response: Therapist met with patient for first session since CCA. Therapist and patient reviewed treatment plan and goals. Pt in agreement. Therapist and patient discussed current stressors, relationship dynamics and self-observations. Therapist provided psychoeducation around concept of behavioral activation. Pt was receptive.   Plan: Return again in 1 week.  Diagnosis: Axis I: Dysthymic Disorder    Axis II: N/A  Josephine Igo, LCSW, LCAS 10/25/19

## 2019-10-31 ENCOUNTER — Other Ambulatory Visit: Payer: Self-pay

## 2019-10-31 ENCOUNTER — Encounter: Payer: Self-pay | Admitting: Licensed Clinical Social Worker

## 2019-10-31 ENCOUNTER — Ambulatory Visit (INDEPENDENT_AMBULATORY_CARE_PROVIDER_SITE_OTHER): Payer: 59 | Admitting: Licensed Clinical Social Worker

## 2019-10-31 DIAGNOSIS — F341 Dysthymic disorder: Secondary | ICD-10-CM

## 2019-10-31 NOTE — Progress Notes (Signed)
Patient ID: Alexander Greer, male   DOB: 07/06/1965, 54 y.o.   MRN: [id number]  Patient Location: Home  Provider Location: Home Office   Virtual Visit via Video Note  I connected with Alexander Greer on 10/31/19 at  1:00 PM EDT by a video enabled telemedicine application and verified that I am speaking with the correct person using two identifiers.   I discussed the limitations of evaluation and management by telemedicine and the availability of in person appointments. The patient expressed understanding and agreed to proceed.  THERAPY PROGRESS NOTE  Session Time: 60 Minutes  Participation Level: Active  Behavioral Response: Well GroomedAlertDysphoric  Type of Therapy: Individual Therapy  Treatment Goals addressed: Communication: Relationships and Coping  Interventions: CBT  Summary: Alexander Greer is a 54 y.o. male who presents with dysthymia. Pt reported that not much has changed since last session and continues to feel unmotivated. Pt reported he wants to leave his career and pursue something new, but not sure what that would be. Pt also is worried about hurting feelings of someone that he is dating, but also wants to be able to set healthy boundaries.  Suicidal/Homicidal: No  Therapist Response: Therapist met with patient for follow up session. Therapist revisited concept of behavioral activation and encouraged patient to describe the next step he can take to move closer toward finding a new career. Pt was receptive. Therapist role played communicating with patient's dating partner about needs and deal breakers.  Plan: Return again in 1 week.  Diagnosis: Axis I: Dysthymic Disorder    Axis II: N/A  Lauren P O'Reilly, LCSW, LCAS 10/31/2019    

## 2019-11-08 ENCOUNTER — Other Ambulatory Visit: Payer: Self-pay

## 2019-11-08 ENCOUNTER — Ambulatory Visit (INDEPENDENT_AMBULATORY_CARE_PROVIDER_SITE_OTHER): Payer: 59 | Admitting: Licensed Clinical Social Worker

## 2019-11-08 ENCOUNTER — Encounter: Payer: Self-pay | Admitting: Licensed Clinical Social Worker

## 2019-11-08 DIAGNOSIS — F341 Dysthymic disorder: Secondary | ICD-10-CM | POA: Diagnosis not present

## 2019-11-08 NOTE — Progress Notes (Signed)
Patient Location: Home  Provider Location: Home Office   Virtual Visit via Video Note  I connected with Alexander Greer on 11/08/19 at  1:00 PM EDT by a video enabled telemedicine application and verified that I am speaking with the correct person using two identifiers.   I discussed the limitations of evaluation and management by telemedicine and the availability of in person appointments. The patient expressed understanding and agreed to proceed.  THERAPY PROGRESS NOTE  Session Time: 30 Minutes  Participation Level: Active  Behavioral Response: Well GroomedAlertDysphoric  Type of Therapy: Individual Therapy  Treatment Goals addressed: Coping  Interventions: CBT and Motivational Interviewing  Summary: Alexander Greer is a 54 y.o. male who presents with dysthymia. Pt continues to emphasize ambivalence about making a change with his career to increase his free time due to the financial incentive to stay. Pt reported observing "I don't get excited about anything" yet has discussed interest in helping other people find their passion and rebuilding cars.    Suicidal/Homicidal: No  Therapist Response: Therapist met with patient for follow up session. Therapist explored patient's thought process and reluctance to make change. Therapist and patient discussed importance of stepping out of familiar comfort zones and being open to new experiences before ruling them out as possible interests. Pt was somewhat receptive. Therapist assigned patient values exploration for homework.  Plan: Return again in 1 week.  Diagnosis: Axis I: Dysthymic Disorder    Axis II: N/A  Josephine Igo, LCSW, LCAS 11/08/2019

## 2019-11-16 ENCOUNTER — Other Ambulatory Visit: Payer: Self-pay

## 2019-11-16 ENCOUNTER — Ambulatory Visit: Payer: 59 | Admitting: Licensed Clinical Social Worker

## 2020-02-06 ENCOUNTER — Other Ambulatory Visit: Payer: Self-pay | Admitting: Adult Health

## 2020-02-06 ENCOUNTER — Telehealth: Payer: Self-pay | Admitting: Adult Health

## 2020-02-06 MED ORDER — VALACYCLOVIR HCL 1 G PO TABS: 1000.0000 mg | ORAL_TABLET | Freq: Every day | ORAL | 1 refills | Status: DC

## 2020-02-06 NOTE — Telephone Encounter (Signed)
Sent to pharmacy. Follow up if new symptoms and per last office visit.

## 2020-02-06 NOTE — Telephone Encounter (Signed)
[facility] faxed refill request for the following medications:  valACYclovir (VALTREX) 1000 MG tablet   Please advise.  Thanks, [employer]

## 2020-03-21 ENCOUNTER — Emergency Department: Payer: 59

## 2020-03-21 ENCOUNTER — Emergency Department
Admission: EM | Admit: 2020-03-21 | Discharge: 2020-03-21 | Disposition: A | Discharge status: Home / Self Care | Payer: 59 | Attending: Emergency Medicine | Admitting: Emergency Medicine

## 2020-03-21 ENCOUNTER — Other Ambulatory Visit: Payer: Self-pay

## 2020-03-21 DIAGNOSIS — Z87891 Personal history of nicotine dependence: Secondary | ICD-10-CM | POA: Diagnosis not present

## 2020-03-21 DIAGNOSIS — K219 Gastro-esophageal reflux disease without esophagitis: Secondary | ICD-10-CM | POA: Diagnosis not present

## 2020-03-21 DIAGNOSIS — I10 Essential (primary) hypertension: Secondary | ICD-10-CM | POA: Diagnosis not present

## 2020-03-21 DIAGNOSIS — E559 Vitamin D deficiency, unspecified: Secondary | ICD-10-CM | POA: Diagnosis not present

## 2020-03-21 DIAGNOSIS — R0789 Other chest pain: Secondary | ICD-10-CM | POA: Insufficient documentation

## 2020-03-21 DIAGNOSIS — H538 Other visual disturbances: Secondary | ICD-10-CM | POA: Insufficient documentation

## 2020-03-21 DIAGNOSIS — Z79899 Other long term (current) drug therapy: Secondary | ICD-10-CM | POA: Insufficient documentation

## 2020-03-21 DIAGNOSIS — R519 Headache, unspecified: Secondary | ICD-10-CM | POA: Diagnosis not present

## 2020-03-21 LAB — CBC
HCT: 48.5 % (ref 39.0–52.0)
Hemoglobin: 15.6 g/dL (ref 13.0–17.0)
MCH: 25.6 pg — ABNORMAL LOW (ref 26.0–34.0)
MCHC: 32.2 g/dL (ref 30.0–36.0)
MCV: 79.5 fL — ABNORMAL LOW (ref 80.0–100.0)
Platelets: 198 10*3/uL (ref 150–400)
RBC: 6.1 MIL/uL — ABNORMAL HIGH (ref 4.22–5.81)
RDW: 14 % (ref 11.5–15.5)
WBC: 4.5 10*3/uL (ref 4.0–10.5)
nRBC: 0 % (ref 0.0–0.2)

## 2020-03-21 LAB — BASIC METABOLIC PANEL
Anion gap: 10 (ref 5–15)
BUN: 10 mg/dL (ref 6–20)
CO2: 25 mmol/L (ref 22–32)
Calcium: 9.3 mg/dL (ref 8.9–10.3)
Chloride: 103 mmol/L (ref 98–111)
Creatinine, Ser: 0.89 mg/dL (ref 0.61–1.24)
GFR, Estimated: >60 mL/min (ref >60)
Glucose, Bld: 104 mg/dL — ABNORMAL HIGH (ref 70–99)
Potassium: 3.7 mmol/L (ref 3.5–5.1)
Sodium: 138 mmol/L (ref 135–145)

## 2020-03-21 LAB — TROPONIN I (HIGH SENSITIVITY): Troponin I (High Sensitivity): 4 ng/L (ref <18)

## 2020-03-21 MED ORDER — LISINOPRIL 5 MG PO TABS: 5.0000 mg | ORAL_TABLET | Freq: Every day | ORAL | 11 refills | Status: AC

## 2020-03-21 NOTE — ED Triage Notes (Signed)
Pt to the er for chest tightness and HTN. Pain started 2 days ago. Pt has a hx of HTN, was placed on meds and then told to stop several years ago. Pt currently does not take any meds. Pt reports a headache.

## 2020-03-21 NOTE — ED Provider Notes (Signed)
[facility] Emergency Department Provider Note   ____________________________________________   Event Date/Time   First MD Initiated Contact with Patient 03/21/20 1711     (approximate)  I have reviewed the triage vital signs and the nursing notes.   HISTORY  Chief Complaint Chest Pain    HPI Alexander Greer is a 54 y.o. male with a stated past medical history of GERD, anxiety/depression, and hypertension who presents for hypertension, headache, and chest pressure that is been intermittent but worsening over the last 2 days.  Patient states that he was initially on medications for hypertension but was taken off by his provider for an unknown reason.  Patient describes a throbbing frontal headache that began 2 days prior to arrival and he initially attributed to allergies.  Patient states that he tried taking Claritin-D and had worsening symptoms from this headache as well this blurred vision and chest tightness.  Patient denies any exertional change to this chest pain.  Patient denies any other exacerbating or relieving factors.  Patient denies any associated shortness of breath, diaphoresis, nausea/vomiting/diarrhea, or weakness/numbness/paresthesias in any extremity.         Past Medical History:  Diagnosis Date  . Anxiety   . Depression   . GERD (gastroesophageal reflux disease)   . Impotence of organic origin   . PVC (premature ventricular contraction)   . Snoring   . Uncomplicated herpes simplex     Patient Active Problem List   Diagnosis Date Noted  . 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 History of diverticulitis 06/20/2019  . Screening for thyroid disorder 06/20/2019  . Vitamin D deficiency 06/20/2019  . Tinea versicolor 02/07/2018  . Gastroesophageal reflux disease 04/29/2015  . Uncomplicated herpes simplex 10/24/2014  . Colon, diverticulosis 01/19/2007  . Family history of cardiovascular disease 01/19/2007  . Hyperplasia,  prostate 01/19/2007  . ED (erectile dysfunction) of organic origin 01/19/2007    Past Surgical History:  Procedure Laterality Date  . PARTIAL COLECTOMY  2006   due to diverticular abscess    Prior to Admission medications   Medication Sig Start Date End Date Taking? Authorizing Provider  Aloe Vera (ALOE VERA MOISTURIZING) 99.5 % GEL Take by mouth.    [provider]  fluconazole (DIFLUCAN) 150 MG tablet Take 1 tablet (150 mg total) by mouth as directed. May repeat dose on day 4 if needed. 06/22/19   Flinchum, Eula Fried, FNP  lisinopril (ZESTRIL) 5 MG tablet Take 1 tablet (5 mg total) by mouth daily. 03/21/20 03/21/21  Merwyn Katos, MD  pantoprazole (PROTONIX) 40 MG tablet Take 1 tablet (40 mg total) by mouth daily. 06/20/19   Flinchum, Eula Fried, FNP  tadalafil (CIALIS) 5 MG tablet Take 1 tablet (5 mg total) by mouth every other day as needed for erectile dysfunction (inform all healthcare providers of use). 10/10/19   Flinchum, Eula Fried, FNP  valACYclovir (VALTREX) 1000 MG tablet Take 1 tablet (1,000 mg total) by mouth daily. Start at first sign of symptom onset. Use for 5 days. 02/06/20   Flinchum, Eula Fried, FNP  Vitamin D, Ergocalciferol, (DRISDOL) 1.25 MG (50000 UNIT) CAPS capsule Take 1 capsule (50,000 Units total) by mouth every 7 (seven) days. (ONCE weekly) Please come to lab walk in to have ordered labs done. 10/10/19   Flinchum, Eula Fried, FNP    Allergies Penicillins  Family History  Problem Relation Age of Onset  . Diabetes Mellitus II Mother   . Healthy Sister  Social History Social History   Tobacco Use  . Smoking status: Former Smoker    Types: Cigarettes  . Smokeless tobacco: Never Used  . Tobacco comment: Quit smoking in his 63s  Substance Use Topics  . Alcohol use: Yes    Alcohol/week: 6.0 standard drinks    Types: 6 Cans of beer per week  . Drug use: No    Review of Systems Constitutional: No fever/chills Eyes: No visual  changes. ENT: No sore throat. Cardiovascular: Endorses chest pain. Respiratory: Denies shortness of breath. Gastrointestinal: No abdominal pain.  No nausea, no vomiting.  No diarrhea. Genitourinary: Negative for dysuria. Musculoskeletal: Negative for acute arthralgias Skin: Negative for rash. Neurological: Endorses headaches, denies weakness/numbness/paresthesias in any extremity Psychiatric: Negative for suicidal ideation/homicidal ideation   ____________________________________________   PHYSICAL EXAM:  VITAL SIGNS: ED Triage Vitals  Enc Vitals Group     BP 03/21/20 1428 135/84     Pulse Rate 03/21/20 1428 70     Resp 03/21/20 1428 18     Temp 03/21/20 1428 97.7 F (36.5 C)     Temp Source 03/21/20 1428 Oral     SpO2 03/21/20 1428 100 %     Weight 03/21/20 1428 195 lb (88.5 kg)     Height 03/21/20 1428 6\' 1"  (1.854 m)     Head Circumference --      Peak Flow --      Pain Score 03/21/20 1427 2     Pain Loc --      Pain Edu? --      Excl. in GC? --    Constitutional: Alert and oriented. Well appearing and in no acute distress. Eyes: Conjunctivae are normal. PERRL. Head: Atraumatic. Nose: No congestion/rhinnorhea. Mouth/Throat: Mucous membranes are moist. Neck: No stridor Cardiovascular: Grossly normal heart sounds.  Good peripheral circulation. Respiratory: Normal respiratory effort.  No retractions. Gastrointestinal: Soft and nontender. No distention. Musculoskeletal: No obvious deformities Neurologic:  Normal speech and language. No gross focal neurologic deficits are appreciated. Skin:  Skin is warm and dry. No rash noted. Psychiatric: Mood and affect are normal. Speech and behavior are normal.  ____________________________________________   LABS (all labs ordered are listed, but only abnormal results are displayed)  Labs Reviewed  BASIC METABOLIC PANEL - Abnormal; Notable for the following components:      Result Value   Glucose, Bld 104 (*)    All other  components within normal limits  CBC - Abnormal; Notable for the following components:   RBC 6.10 (*)    MCV 79.5 (*)    MCH 25.6 (*)    All other components within normal limits  TROPONIN I (HIGH SENSITIVITY)   ____________________________________________  EKG  ED ECG REPORT I, 14/10/21, the attending physician, personally viewed and interpreted this ECG.  Date: 03/21/2020 EKG Time: 1304 Rate: 64 Rhythm: normal sinus rhythm QRS Axis: normal Intervals: normal ST/T Wave abnormalities: normal Narrative Interpretation: no evidence of acute ischemia  ____________________________________________  RADIOLOGY  ED MD interpretation: 2 view x-ray of the chest shows no evidence of acute abnormalities including no pneumonia, pneumothorax, or widened mediastinum.  Official radiology report(s): DG Chest 2 View  Result Date: 03/21/2020 CLINICAL DATA:  Chest pain, hypertension EXAM: CHEST - 2 VIEW COMPARISON:  11/16/2004 chest radiograph. FINDINGS: Stable cardiomediastinal silhouette with normal heart size. No pneumothorax. No pleural effusion. Lungs appear clear, with no acute consolidative airspace disease and no pulmonary edema. IMPRESSION: No active cardiopulmonary disease. Electronically Signed   By: 01/16/2005  A Poff M.D.   On: 03/21/2020 15:14    ____________________________________________   PROCEDURES  Procedure(s) performed (including Critical Care):  .1-3 Lead EKG Interpretation Performed by: Merwyn Katos, MD Authorized by: Merwyn Katos, MD     Interpretation: normal     ECG rate:  67   ECG rate assessment: normal     Rhythm: sinus rhythm     Ectopy: none     Conduction: normal       ____________________________________________   INITIAL IMPRESSION / ASSESSMENT AND PLAN / ED COURSE  As part of my medical decision making, I reviewed the following data within the electronic MEDICAL RECORD NUMBER Nursing notes reviewed and incorporated, Labs reviewed, EKG  interpreted, Old chart reviewed, Radiograph reviewed and Notes from prior ED visits reviewed and incorporated        Workup: ECG, CXR, CBC, BMP, Troponin Findings: ECG: No overt evidence of STEMI. No evidence of Brugadas sign, delta wave, epsilon wave, significantly prolonged QTc, or malignant arrhythmia HS Troponin: Negative x1 Other Labs unremarkable for emergent problems. CXR: Without PTX, PNA, or widened mediastinum Last Stress Test: Never Last Heart Catheterization: Never HEART Score: 3  Given History, Exam, and Workup I have low suspicion for ACS, Pneumothorax, Pneumonia, Pulmonary Embolus, Tamponade, Aortic Dissection or other emergent problem as a cause for this presentation.   Reassesment: Prior to discharge patients pain was controlled and they were well appearing.  Disposition:  Discharge. Strict return precautions discussed with patient with full understanding. Advised patient to follow up promptly with primary care provider      ____________________________________________   FINAL CLINICAL IMPRESSION(S) / ED DIAGNOSES  Final diagnoses:  Hypertension, unspecified type  Atypical chest pain  Frontal headache     ED Discharge Orders         Ordered    lisinopril (ZESTRIL) 5 MG tablet  Daily        03/21/20 1725           Note:  This document was prepared using Dragon voice recognition software and may include unintentional dictation errors.   Merwyn Katos, MD 03/21/20 [phone]

## 2020-03-21 NOTE — ED Notes (Addendum)
See triage note. Provider at bedside. Pt sitting calmly in chair. Pt reports a "sore chest", high blood pressure, and a HA. Denies major cardiac history. Pt A&Ox4. Reports sinus issues and recent blurred vision at times. States vision is currently baseline.

## 2021-02-09 ENCOUNTER — Other Ambulatory Visit: Payer: Self-pay | Admitting: Adult Health

## 2021-02-11 ENCOUNTER — Encounter: Payer: Self-pay | Admitting: Adult Health

## 2021-02-11 ENCOUNTER — Other Ambulatory Visit: Payer: Self-pay | Admitting: Family Medicine

## 2021-02-11 NOTE — Telephone Encounter (Signed)
Requested medication (s) are due for refill today:yes  Requested medication (s) are on the active medication list: yes  Last refill: 02/06/20  #90  1 refill  Future visit scheduled no  Notes to clinic:  Needs appt. Attempted to  reach pt, number provided not in service. Please review.  Requested Prescriptions  Pending Prescriptions Disp Refills   valACYclovir (VALTREX) 1000 MG tablet 90 tablet 1    Sig: Take 1 tablet (1,000 mg total) by mouth daily. Start at first sign of symptom onset. Use for 5 days.     Antimicrobials:  Antiviral Agents - Anti-Herpetic Failed - 02/11/2021  2:28 PM      Failed - Valid encounter within last 12 months    Recent Outpatient Visits           1 year ago Annual physical exam   [facility] Flinchum, Eula Fried, FNP   3 years ago Uncomplicated herpes simplex   [facility] [place], [place], [place]   5 years ago Uncomplicated herpes simplex   [facility] [place], [place], [place]

## 2021-02-11 NOTE — Telephone Encounter (Signed)
Medication Refill - Medication:  valACYclovir (VALTREX) 1000 MG tablet   Has the patient contacted their pharmacy? Yes.   Contact PCP  Preferred Pharmacy (with phone number or street name):  [facility] [id number] [place], [place] - [address]  [address] [place], [place] [address]  Phone:  [phone]  Fax:  [phone]   Has the patient been seen for an appointment in the last year OR does the patient have an upcoming appointment? Yes.    Agent: Please be advised that RX refills may take up to 3 business days. We ask that you follow-up with your pharmacy.

## 2021-02-11 NOTE — Telephone Encounter (Signed)
ERROR

## 2021-09-10 IMAGING — CR DG CHEST 2V
2 series · 2 of 2 positions shown · non-contrast
Comparison: 11/16/2004 chest radiograph.

CLINICAL DATA: Chest pain, hypertension

EXAM:
CHEST - 2 VIEW

[chest pa]
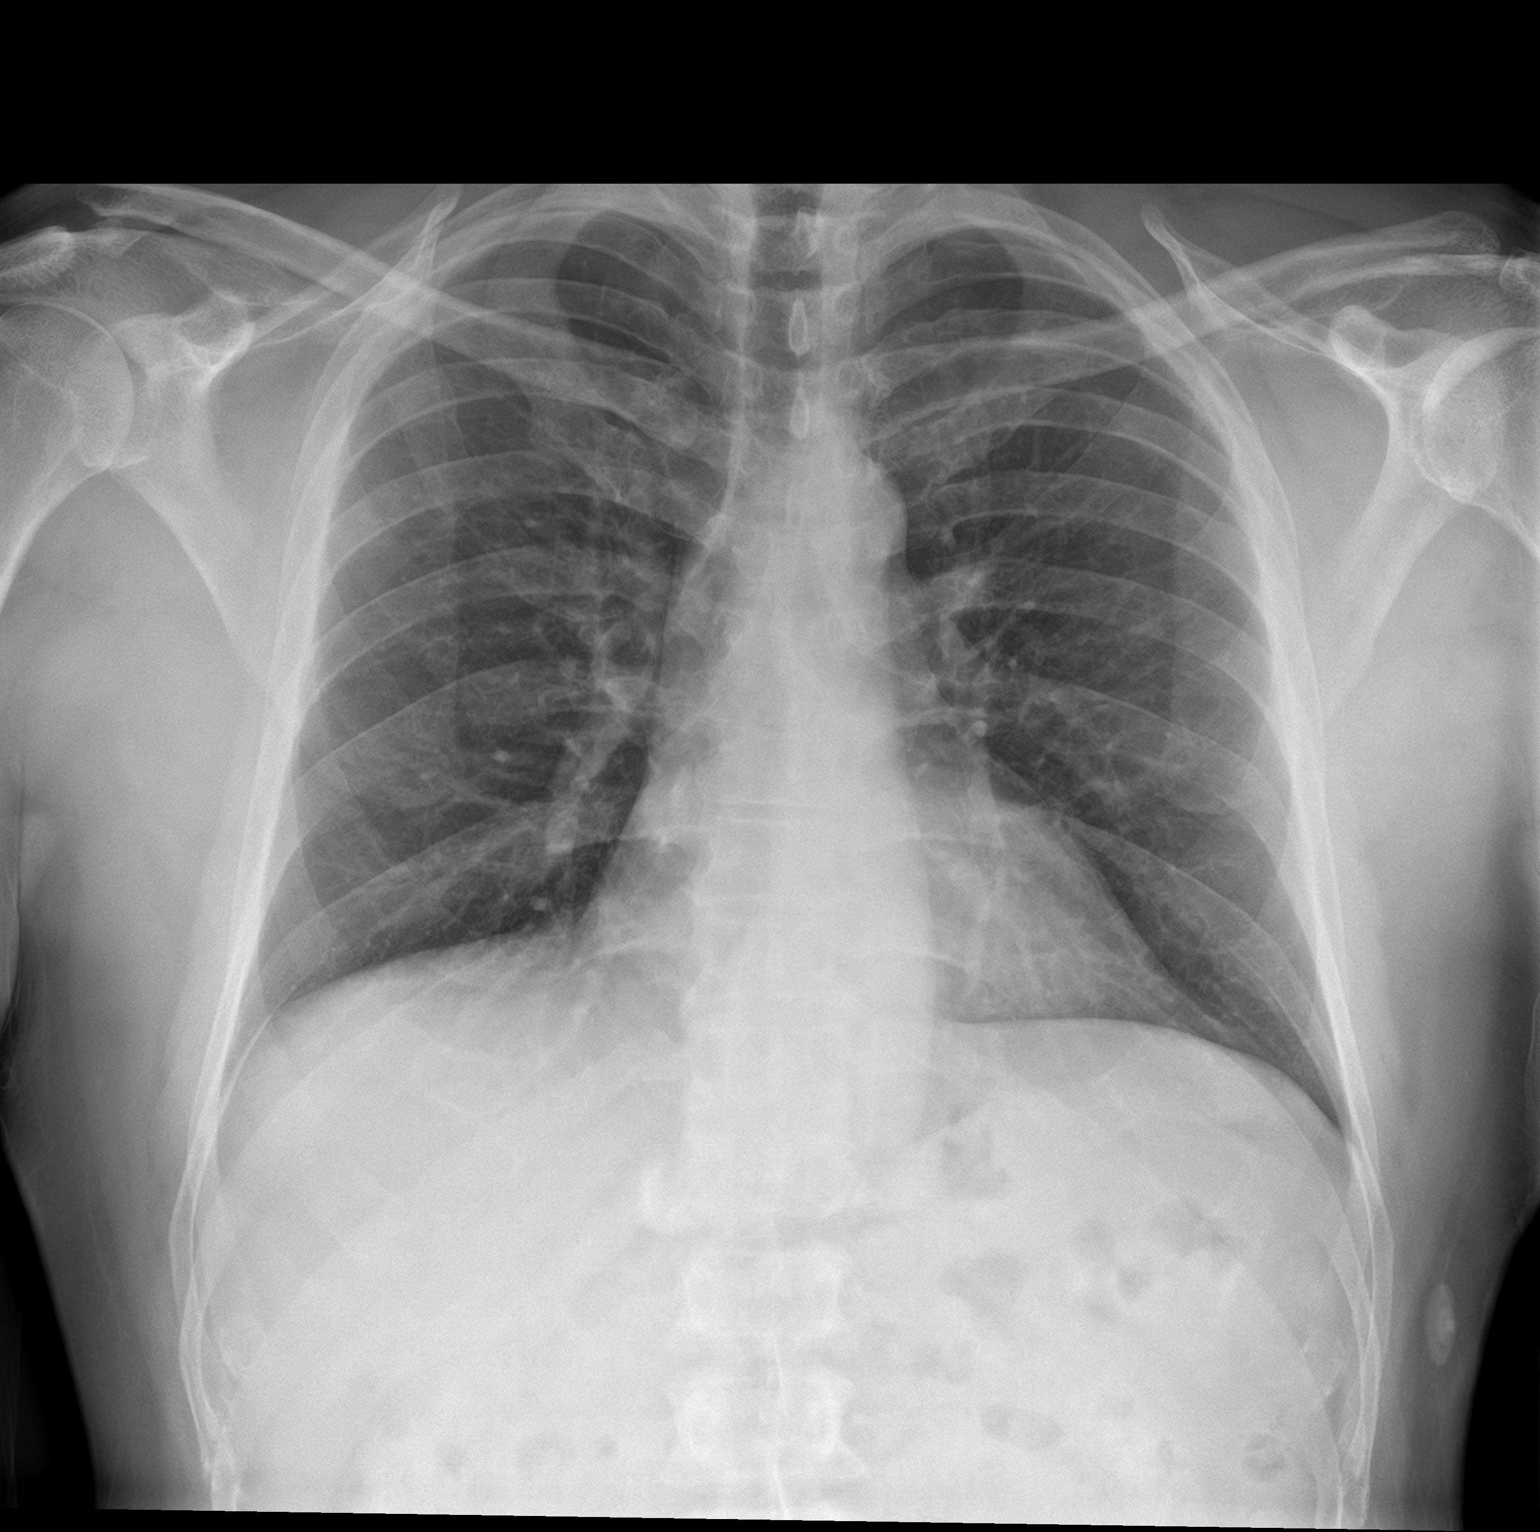

[chest lat]
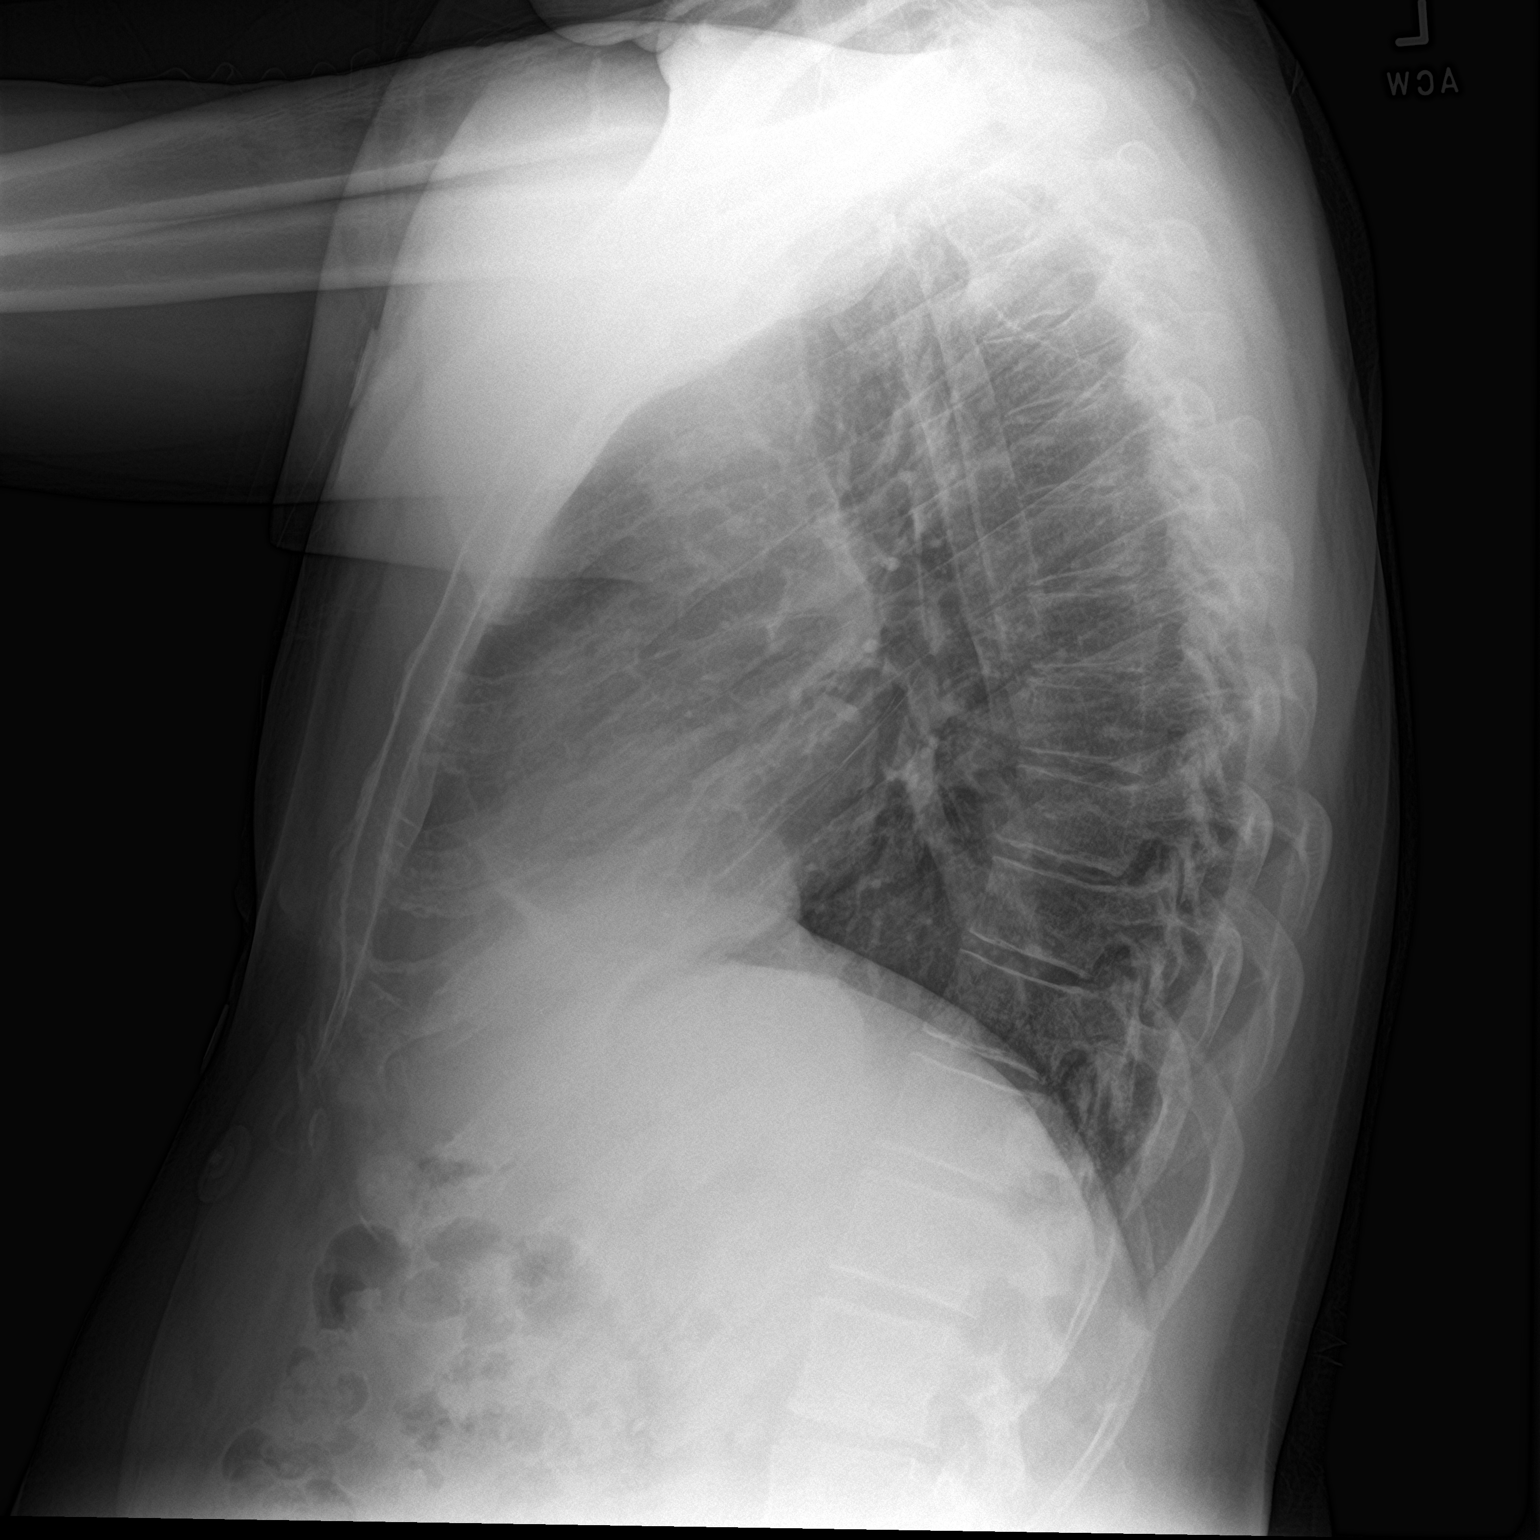

[2 of 2 positions shown; findings below may reference images not displayed]

FINDINGS: Stable cardiomediastinal silhouette with normal heart size. No
pneumothorax. No pleural effusion. Lungs appear clear, with no acute
consolidative airspace disease and no pulmonary edema.
IMPRESSION: No active cardiopulmonary disease.

## 2021-12-04 ENCOUNTER — Ambulatory Visit: Payer: 59 | Admitting: General Practice

## 2021-12-04 ENCOUNTER — Encounter: Payer: Self-pay | Admitting: *Deleted

## 2021-12-04 ENCOUNTER — Encounter
Admission: RE | Disposition: A | Discharge status: Home / Self Care | Payer: Self-pay | Source: Home / Self Care | Attending: Gastroenterology

## 2021-12-04 ENCOUNTER — Ambulatory Visit
Admission: RE | Admit: 2021-12-04 | Discharge: 2021-12-04 | Disposition: A | Discharge status: Home / Self Care | Payer: 59 | Attending: Gastroenterology | Admitting: Gastroenterology

## 2021-12-04 DIAGNOSIS — R053 Chronic cough: Secondary | ICD-10-CM | POA: Insufficient documentation

## 2021-12-04 DIAGNOSIS — K449 Diaphragmatic hernia without obstruction or gangrene: Secondary | ICD-10-CM | POA: Insufficient documentation

## 2021-12-04 DIAGNOSIS — K2100 Gastro-esophageal reflux disease with esophagitis, without bleeding: Secondary | ICD-10-CM | POA: Diagnosis not present

## 2021-12-04 DIAGNOSIS — K219 Gastro-esophageal reflux disease without esophagitis: Secondary | ICD-10-CM | POA: Diagnosis present

## 2021-12-04 DIAGNOSIS — Z87891 Personal history of nicotine dependence: Secondary | ICD-10-CM | POA: Insufficient documentation

## 2021-12-04 HISTORY — PX: ESOPHAGOGASTRODUODENOSCOPY (EGD) WITH PROPOFOL: SHX5813

## 2021-12-04 SURGERY — ESOPHAGOGASTRODUODENOSCOPY (EGD) WITH PROPOFOL
Anesthesia: General

## 2021-12-04 MED ORDER — GLYCOPYRROLATE 0.2 MG/ML IJ SOLN
INTRAMUSCULAR | Status: DC | PRN
  Administered 2021-12-04: .2 mg via INTRAVENOUS

## 2021-12-04 MED ORDER — PROPOFOL 10 MG/ML IV BOLUS
INTRAVENOUS | Status: DC | PRN
  Administered 2021-12-04: 10 mg via INTRAVENOUS
  Administered 2021-12-04: 70 mg via INTRAVENOUS
  Administered 2021-12-04: 10 mg via INTRAVENOUS

## 2021-12-04 MED ORDER — PROPOFOL 500 MG/50ML IV EMUL
INTRAVENOUS | Status: DC | PRN
  Administered 2021-12-04: 145 ug/kg/min via INTRAVENOUS

## 2021-12-04 MED ORDER — LIDOCAINE HCL (CARDIAC) PF 100 MG/5ML IV SOSY
PREFILLED_SYRINGE | INTRAVENOUS | Status: DC | PRN
  Administered 2021-12-04: 100 mg via INTRAVENOUS

## 2021-12-04 MED ORDER — SODIUM CHLORIDE 0.9 % IV SOLN: INTRAVENOUS | Status: DC

## 2021-12-04 NOTE — Transfer of Care (Signed)
Immediate Anesthesia Transfer of Care Note  Patient: Alexander Greer  Procedure(s) Performed: ESOPHAGOGASTRODUODENOSCOPY (EGD) WITH PROPOFOL  Patient Location: Endoscopy Unit  Anesthesia Type:General  Level of Consciousness: awake, drowsy and patient cooperative  Airway & Oxygen Therapy: Patient Spontanous Breathing and Patient connected to face mask oxygen  Post-op Assessment: Report given to RN and Post -op Vital signs reviewed and stable  Post vital signs: Reviewed and stable  Last Vitals:  Vitals Value Taken Time  BP 119/83 12/04/21 0835  Temp 35.9 C 12/04/21 0835  Pulse 91 12/04/21 0837  Resp 19 12/04/21 0837  SpO2 100 % 12/04/21 0837  Vitals shown include unvalidated device data.  Last Pain:  Vitals:   12/04/21 0835  TempSrc: Temporal  PainSc: Asleep         Complications: No notable events documented.

## 2021-12-04 NOTE — H&P (Signed)
Outpatient short stay form Pre-procedure 12/04/2021  Regis Bill, MD  Primary Physician: Pcp, No  Reason for visit:  Chronic Cough  History of present illness:    56 y/o gentleman with history of GERD here for EGD due to chronic cough. Cough not responsive to PPI. No family history of GI malignancies. No blood thinners. No significant abdominal surgeries.    Current Facility-Administered Medications:    0.9 %  sodium chloride infusion, , Intravenous, Continuous, Derik Fults, Rossie Muskrat, MD, Last Rate: 20 mL/hr at 12/04/21 0736, New Bag at 12/04/21 0736  Medications Prior to Admission  Medication Sig Dispense Refill Last Dose   pantoprazole (PROTONIX) 40 MG tablet Take 1 tablet (40 mg total) by mouth daily. 90 tablet 0 12/04/2021   Aloe Vera (ALOE VERA MOISTURIZING) 99.5 % GEL Take by mouth.      fluconazole (DIFLUCAN) 150 MG tablet Take 1 tablet (150 mg total) by mouth as directed. May repeat dose on day 4 if needed. 2 tablet 0    lisinopril (ZESTRIL) 5 MG tablet Take 1 tablet (5 mg total) by mouth daily. 30 tablet 11 11/26/2021   tadalafil (CIALIS) 5 MG tablet Take 1 tablet (5 mg total) by mouth every other day as needed for erectile dysfunction (inform all healthcare providers of use). 10 tablet 0    valACYclovir (VALTREX) 1000 MG tablet Take 1 tablet (1,000 mg total) by mouth daily. Start at first sign of symptom onset. Use for 5 days. 90 tablet 1    Vitamin D, Ergocalciferol, (DRISDOL) 1.25 MG (50000 UNIT) CAPS capsule Take 1 capsule (50,000 Units total) by mouth every 7 (seven) days. (ONCE weekly) Please come to lab walk in to have ordered labs done. 7 capsule 0      Allergies  Allergen Reactions   Penicillins      Past Medical History:  Diagnosis Date   Anxiety    Depression    GERD (gastroesophageal reflux disease)    Impotence of organic origin    PVC (premature ventricular contraction)    Snoring    Uncomplicated herpes simplex     Review of systems:  Otherwise  negative.    Physical Exam  Gen: Alert, oriented. Appears stated age.  HEENT: PERRLA. Lungs: No respiratory distress CV: RRR Abd: soft, benign, no masses Ext: No edema    Planned procedures: Proceed with EGD. The patient understands the nature of the planned procedure, indications, risks, alternatives and potential complications including but not limited to bleeding, infection, perforation, damage to internal organs and possible oversedation/side effects from anesthesia. The patient agrees and gives consent to proceed.  Please refer to procedure notes for findings, recommendations and patient disposition/instructions.     Regis Bill, MD [facility]

## 2021-12-04 NOTE — Anesthesia Postprocedure Evaluation (Signed)
Anesthesia Post Note  Patient: Alexander Greer  Procedure(s) Performed: ESOPHAGOGASTRODUODENOSCOPY (EGD) WITH PROPOFOL  Patient location during evaluation: Endoscopy Anesthesia Type: General Level of consciousness: awake and alert Pain management: pain level controlled Vital Signs Assessment: post-procedure vital signs reviewed and stable Respiratory status: spontaneous breathing, nonlabored ventilation, respiratory function stable and patient connected to nasal cannula oxygen Cardiovascular status: blood pressure returned to baseline and stable Postop Assessment: no apparent nausea or vomiting Anesthetic complications: no   No notable events documented.   Last Vitals:  Vitals:   12/04/21 0845 12/04/21 0855  BP: (!) 118/102 (!) 129/93  Pulse:    Resp: 17 17  Temp:    SpO2:  99%    Last Pain:  Vitals:   12/04/21 0855  TempSrc:   PainSc: 0-No pain                 Stephanie Coup

## 2021-12-04 NOTE — Interval H&P Note (Signed)
History and Physical Interval Note:  12/04/2021 8:17 AM  Alexander Greer  has presented today for surgery, with the diagnosis of GERD Dry Cough.  The various methods of treatment have been discussed with the patient and family. After consideration of risks, benefits and other options for treatment, the patient has consented to  Procedure(s): ESOPHAGOGASTRODUODENOSCOPY (EGD) WITH PROPOFOL (N/A) as a surgical intervention.  The patient's history has been reviewed, patient examined, no change in status, stable for surgery.  I have reviewed the patient's chart and labs.  Questions were answered to the patient's satisfaction.     Regis Bill  Ok to proceed with EGD

## 2021-12-04 NOTE — Anesthesia Preprocedure Evaluation (Signed)
Anesthesia Evaluation  Patient identified by MRN, date of birth, ID band Patient awake    Reviewed: Allergy & Precautions, NPO status , Patient's Chart, lab work & pertinent test results  Airway Mallampati: III  TM Distance: >3 FB Neck ROM: full    Dental  (+) Teeth Intact, Dental Advidsory Given   Pulmonary neg pulmonary ROS, former smoker,    Pulmonary exam normal        Cardiovascular negative cardio ROS Normal cardiovascular exam     Neuro/Psych PSYCHIATRIC DISORDERS negative neurological ROS     GI/Hepatic Neg liver ROS, GERD  ,  Endo/Other  negative endocrine ROS  Renal/GU negative Renal ROS  negative genitourinary   Musculoskeletal   Abdominal   Peds  Hematology negative hematology ROS (+)   Anesthesia Other Findings Past Medical History: No date: Anxiety No date: Depression No date: GERD (gastroesophageal reflux disease) No date: Impotence of organic origin No date: PVC (premature ventricular contraction) No date: Snoring No date: Uncomplicated herpes simplex  Past Surgical History: No date: Diverticulitis  04/12/2004: PARTIAL COLECTOMY     Comment:  due to diverticular abscess  BMI    Body Mass Index: 28.37 kg/m      Reproductive/Obstetrics negative OB ROS                             Anesthesia Physical Anesthesia Plan  ASA: 2  Anesthesia Plan: General   Post-op Pain Management: Minimal or no pain anticipated   Induction: Intravenous  PONV Risk Score and Plan: 3 and Propofol infusion, TIVA and Ondansetron  Airway Management Planned: Nasal Cannula  Additional Equipment: None  Intra-op Plan:   Post-operative Plan:   Informed Consent: I have reviewed the patients History and Physical, chart, labs and discussed the procedure including the risks, benefits and alternatives for the proposed anesthesia with the patient or authorized representative who has  indicated his/her understanding and acceptance.     Dental advisory given  Plan Discussed with: CRNA and Surgeon  Anesthesia Plan Comments: (Discussed risks of anesthesia with patient, including possibility of difficulty with spontaneous ventilation under anesthesia necessitating airway intervention, PONV, and rare risks such as cardiac or respiratory or neurological events, and allergic reactions. Discussed the role of CRNA in patient's perioperative care. Patient understands.)        Anesthesia Quick Evaluation

## 2021-12-04 NOTE — Anesthesia Procedure Notes (Signed)
Procedure Name: General with mask airway Date/Time: 12/04/2021 8:30 AM  Performed by: Mohammed Kindle, CRNAPre-anesthesia Checklist: Patient identified, Emergency Drugs available, Suction available and Patient being monitored Patient Re-evaluated:Patient Re-evaluated prior to induction Oxygen Delivery Method: Simple face mask Induction Type: IV induction Placement Confirmation: positive ETCO2, CO2 detector and breath sounds checked- equal and bilateral Dental Injury: Teeth and Oropharynx as per pre-operative assessment

## 2021-12-04 NOTE — Op Note (Signed)
[facility] Patient Name: Alexander Greer Procedure Date: 12/04/2021 8:17 AM MRN: [id number] Account #: [id number] Date of Birth: 16-Dec-1965 Admit Type: Outpatient Age: 56 Room: [facility] ENDO ROOM 3 Gender: Male Note Status: Finalized Instrument Name: Upper Endoscope [id number] Procedure:             Upper GI endoscopy Indications:           Gastro-esophageal reflux disease, Chronic cough Providers:             Andrey Farmer MD, MD Referring MD:          No Local Md, MD (Referring MD) Medicines:             Monitored Anesthesia Care Complications:         No immediate complications. Procedure:             Pre-Anesthesia Assessment:                        - Prior to the procedure, a History and Physical was                         performed, and patient medications and allergies were                         reviewed. The patient is competent. The risks and                         benefits of the procedure and the sedation options and                         risks were discussed with the patient. All questions                         were answered and informed consent was obtained.                         Patient identification and proposed procedure were                         verified by the physician, the nurse, the                         anesthesiologist, the anesthetist and the technician                         in the endoscopy suite. Mental Status Examination:                         alert and oriented. Airway Examination: normal                         oropharyngeal airway and neck mobility. Respiratory                         Examination: clear to auscultation. CV Examination:                         normal. Prophylactic Antibiotics: The patient does not  require prophylactic antibiotics. Prior                         Anticoagulants: The patient has taken no previous                         anticoagulant or antiplatelet  agents. ASA Grade                         Assessment: II - A patient with mild systemic disease.                         After reviewing the risks and benefits, the patient                         was deemed in satisfactory condition to undergo the                         procedure. The anesthesia plan was to use monitored                         anesthesia care (MAC). Immediately prior to                         administration of medications, the patient was                         re-assessed for adequacy to receive sedatives. The                         heart rate, respiratory rate, oxygen saturations,                         blood pressure, adequacy of pulmonary ventilation, and                         response to care were monitored throughout the                         procedure. The physical status of the patient was                         re-assessed after the procedure.                        After obtaining informed consent, the endoscope was                         passed under direct vision. Throughout the procedure,                         the patient's blood pressure, pulse, and oxygen                         saturations were monitored continuously. The                         Endosonoscope was introduced through the mouth, and  advanced to the second part of duodenum. The upper GI                         endoscopy was accomplished without difficulty. The                         patient tolerated the procedure well. Findings:      LA Grade A (one or more mucosal breaks less than 5 mm, not extending       between tops of 2 mucosal folds) esophagitis with no bleeding was found.      A 3 cm hiatal hernia was present.      The exam of the esophagus was otherwise normal.      The entire examined stomach was normal.      The examined duodenum was normal. Impression:            - LA Grade A reflux esophagitis with no bleeding.                        - 3 cm  hiatal hernia.                        - Normal stomach.                        - Normal examined duodenum.                        - No specimens collected. Recommendation:        - Discharge patient to home.                        - Resume previous diet.                        - Continue present medications.                        - Perform ambulatory pH and impedance monitoring at                         appointment to be scheduled. Procedure Code(s):     --- Professional ---                        [phone], Esophagogastroduodenoscopy, flexible,                         transoral; diagnostic, including collection of                         specimen(s) by brushing or washing, when performed                         (separate procedure) Diagnosis Code(s):     --- Professional ---                        K21.00, Gastro-esophageal reflux disease with                         esophagitis, without bleeding  K44.9, Diaphragmatic hernia without obstruction or                         gangrene                        R05, Cough CPT copyright 2019 American Medical Association. All rights reserved. The codes documented in this report are preliminary and upon coder review may  be revised to meet current compliance requirements. Andrey Farmer MD, MD 12/04/2021 8:44:52 AM Number of Addenda: 0 Note Initiated On: 12/04/2021 8:17 AM Estimated Blood Loss:  Estimated blood loss: none.      [facility]

## 2021-12-05 NOTE — Progress Notes (Signed)
Non-identified Voicemail.  No Message Left. 

## 2021-12-07 ENCOUNTER — Encounter: Payer: Self-pay | Admitting: Gastroenterology

## 2022-05-04 ENCOUNTER — Ambulatory Visit
Admission: EM | Admit: 2022-05-04 | Discharge: 2022-05-04 | Disposition: A | Discharge status: Home / Self Care | Payer: No Typology Code available for payment source

## 2022-05-04 ENCOUNTER — Encounter: Payer: Self-pay | Admitting: Emergency Medicine

## 2022-05-04 DIAGNOSIS — F419 Anxiety disorder, unspecified: Secondary | ICD-10-CM

## 2022-05-04 DIAGNOSIS — I10 Essential (primary) hypertension: Secondary | ICD-10-CM | POA: Diagnosis not present

## 2022-05-04 MED ORDER — HYDROXYZINE HCL 25 MG PO TABS: 25.0000 mg | ORAL_TABLET | Freq: Four times a day (QID) | ORAL | 0 refills | Status: DC

## 2022-05-04 NOTE — Discharge Instructions (Addendum)
Your blood pressure is elevated in clinic but your EKG was reassuring.  This may entirely be a stress reaction.  I want you to get a blood pressure cuff and monitor your blood pressure at home periodically to make sure that it stays less than 140/90.  If your blood pressure stays above 140/90 you may need to talk to your primary care provider about restarting blood pressure medication.  Use the hydroxyzine every 6 hours as needed for anxiety, chest tightness, or feeling stressed.  Make an appointment with your primary care provider to discuss continuing the hydroxyzine if it works for you or else prescribing you a low-dose Xanax prescription as you have been prescribed in the past.  If you develop any headache, have a return of blurred vision, develop any dizziness, slurred speech, chest pain, or weakness in any of your extremities you need to call 911 and go to the ER.

## 2022-05-04 NOTE — ED Triage Notes (Addendum)
Pt was at work and developed blurred vision and asked to have his blood pressure checked and it was elevated. The reading was 160's/90's. Patient took Lisinopril in the past has not bee on it in 3 months.

## 2022-05-04 NOTE — ED Provider Notes (Signed)
[facility]    CSN: [id number] Arrival date & time: 05/04/22  1723      History   Chief Complaint Chief Complaint  Patient presents with   Hypertension    HPI Alexander Greer is a 57 y.o. male.   HPI  57 year old male here for evaluation of elevated blood pressure.  The patient reports that he was at work and he noticed that his vision was getting blurry and so he asked to have his blood pressure checked and his readings were 160s over 90s.  He states that his blurred vision has resolved but he is still having some chest tightness.  He has had elevated blood pressure in the past and he had been on lisinopril until 3 months ago when he was taken off due to developing a chronic cough.  He was not restarted on any other blood pressure medication because his blood pressure was normal.  He denies any shortness of breath, dizziness, or headache.  He states that in the past when he had elevated blood pressure it has been associated with stress and anxiety and it resolved with a low-dose Xanax prescription.  He does report that he has had increased work stress, increased anxiety, and he is also moving at present.  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   History of diverticulitis 06/20/2019   Screening for thyroid disorder 06/20/2019   Vitamin D deficiency 06/20/2019   Tinea versicolor 02/07/2018   Gastroesophageal reflux disease [id number]   Uncomplicated herpes simplex 10/24/2014   Colon, diverticulosis 01/19/2007   Family history of cardiovascular disease 01/19/2007   Hyperplasia, 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 Authorizing Provider  FLUoxetine HCl (PROZAC PO) Take by mouth.   Yes [provider]  hydrOXYzine (ATARAX) 25 MG tablet Take 1 tablet (25 mg total) by mouth every 6 (six) hours. 05/04/22  Yes Margarette Canada, NP  Aloe Vera (ALOE VERA MOISTURIZING) 99.5 % GEL Take by mouth.    [provider]  fluconazole (DIFLUCAN) 150 MG tablet Take 1 tablet (150 mg total) by mouth as directed. May repeat dose on day 4 if needed. 06/22/19   Flinchum, Kelby Aline, FNP  lisinopril (ZESTRIL) 5 MG tablet Take 1 tablet (5 mg total) by mouth daily. 03/21/20 03/21/21  Naaman Plummer, MD  pantoprazole (PROTONIX) 40 MG tablet Take 1 tablet (40 mg total) by mouth daily. 06/20/19   Flinchum, Kelby Aline, FNP  tadalafil (CIALIS) 5 MG tablet Take 1 tablet (5 mg total) by mouth every other day as needed for erectile dysfunction (inform all healthcare providers of use). 10/10/19   Flinchum, Kelby Aline, FNP  valACYclovir (  VALTREX) 1000 MG tablet Take 1 tablet (1,000 mg total) by mouth daily. Start at first sign of symptom onset. Use for 5 days. 02/06/20   Flinchum, Eula Fried, FNP  Vitamin D, Ergocalciferol, (DRISDOL) 1.25 MG (50000 UNIT) CAPS capsule Take 1 capsule (50,000 Units total) by mouth every 7 (seven) days. (ONCE weekly) Please come to lab walk in to have ordered labs done. 10/10/19   Flinchum, Eula Fried, FNP    Family History Family History  Problem Relation Age of Onset   Diabetes Mellitus II Mother    Healthy Sister     Social History Social History   Tobacco  Use   Smoking status: Former    Types: Cigarettes   Smokeless tobacco: Never   Tobacco comments:    Quit smoking in his 72s  Vaping Use   Vaping Use: Never used  Substance Use Topics   Alcohol use: Yes    Alcohol/week: 6.0 standard drinks of alcohol    Types: 6 Cans of beer per week   Drug use: No     Allergies   Penicillins   Review of Systems Review of Systems  Eyes:  Positive for visual disturbance.  Respiratory:  Positive for chest tightness. Negative for shortness of breath.   Cardiovascular:  Negative for chest pain and palpitations.  Neurological:  Negative for dizziness, syncope and headaches.     Physical Exam Triage Vital Signs ED Triage Vitals  Enc Vitals Group     BP 05/04/22 1907 (!) 177/99     Pulse Rate 05/04/22 1907 86     Resp 05/04/22 1907 18     Temp 05/04/22 1907 98.1 F (36.7 C)     Temp Source 05/04/22 1907 Oral     SpO2 05/04/22 1907 97 %     Weight --      Height --      Head Circumference --      Peak Flow --      Pain Score 05/04/22 1905 0     Pain Loc --      Pain Edu? --      Excl. in GC? --    No data found.  Updated Vital Signs BP (!) 177/99 (BP Location: Left Arm)   Pulse 86   Temp 98.1 F (36.7 C) (Oral)   Resp 18   SpO2 97%   Visual Acuity Right Eye Distance:   Left Eye Distance:   Bilateral Distance:    Right Eye Near:   Left Eye Near:    Bilateral Near:     Physical Exam Vitals and nursing note reviewed.  Constitutional:      Appearance: Normal appearance. He is not ill-appearing.  HENT:     Head: Normocephalic and atraumatic.  Eyes:     General: No scleral icterus.    Extraocular Movements: Extraocular movements intact.     Pupils: Pupils are equal, round, and reactive to light.  Cardiovascular:     Rate and Rhythm: Normal rate and regular rhythm.     Pulses: Normal pulses.     Heart sounds: Normal heart sounds. No murmur heard.    No friction rub. No gallop.  Pulmonary:     Effort: Pulmonary  effort is normal.     Breath sounds: Normal breath sounds. No wheezing, rhonchi or rales.  Musculoskeletal:     Cervical back: Normal range of motion and neck supple.  Skin:    General: Skin is warm and dry.  Capillary Refill: Capillary refill takes less than 2 seconds.     Findings: No erythema or rash.  Neurological:     General: No focal deficit present.     Mental Status: He is alert and oriented to person, place, and time.  Psychiatric:        Mood and Affect: Mood normal.        Behavior: Behavior normal.        Thought Content: Thought content normal.        Judgment: Judgment normal.      UC Treatments / Results  Labs (all labs ordered are listed, but only abnormal results are displayed) Labs Reviewed - No data to display  EKG Normal sinus rhythm with ventricular rate of 63 bpm PR interval 188 ms QRS duration 74 ms QT/QTc 396/405 ms No ST or T wave abnormalities noted.  No other tracings available for comparison in epic.  Radiology No results found.  Procedures Procedures (including critical care time)  Medications Ordered in UC Medications - No data to display  Initial Impression / Assessment and Plan / UC Course  I have reviewed the triage vital signs and the nursing notes.  Pertinent labs & imaging results that were available during my care of the patient were reviewed by me and considered in my medical decision making (see chart for details).   Patient is a pleasant, nontoxic-appearing 56 year old male here for evaluation of elevated blood pressure that is associated with chest tightness and blurred vision.  The blurred vision has resolved but the chest tightness remains.  He reports that he had been on lisinopril but he was taken off of it 3 months ago due to having a chronic dry cough.  He was not started on any other antihypertensives because his blood pressure was normal at that time.  In the past when his blood pressure has gone up it has been due to  stress and anxiety and he reports increased job stress, increased anxiety, and also the stress of recently moving.  In the past he has been prescribed low-dose Xanax which helped his symptoms.  I did advise the patient we do not prescribe Xanax and this urgent care but we can prescribe hydroxyzine which can function very similar.  I will obtain EKG to look for any cardiac abnormalities.  Patient's heart sounds are S1 S2 and his lung sounds are clear to auscultation all fields.  His pupils are equal round reactive and his EOMs intact.  If his EKG is normal I will have him follow-up with internal medicine at [facility] clinic who he has seen in the past.  Patient's EKG shows normal sinus rhythm without any T wave or ST abnormalities.  I will discharge patient home with a prescription for hydroxyzine that he can use for anxiety symptoms and see if this helps lower his blood pressure.  He should make an appointment with his PCP for blood pressure recheck as well as to discuss anxiolytic such as Xanax as he has been on before.  Final Clinical Impressions(s) / UC Diagnoses   Final diagnoses:  Hypertension, unspecified type  Anxiety     Discharge Instructions      Your blood pressure is elevated in clinic but your EKG was reassuring.  This may entirely be a stress reaction.  I want you to get a blood pressure cuff and monitor your blood pressure at home periodically to make sure that it stays less than 140/90.  If your blood pressure stays  above 140/90 you may need to talk to your primary care provider about restarting blood pressure medication.  Use the hydroxyzine every 6 hours as needed for anxiety, chest tightness, or feeling stressed.  Make an appointment with your primary care provider to discuss continuing the hydroxyzine if it works for you or else prescribing you a low-dose Xanax prescription as you have been prescribed in the past.  If you develop any headache, have a return of blurred  vision, develop any dizziness, slurred speech, chest pain, or weakness in any of your extremities you need to call 911 and go to the ER.     ED Prescriptions     Medication Sig Dispense Auth. Provider   hydrOXYzine (ATARAX) 25 MG tablet Take 1 tablet (25 mg total) by mouth every 6 (six) hours. 12 tablet Margarette Canada, NP      PDMP not reviewed this encounter.   Margarette Canada, NP 05/04/22 1940

## 2022-08-21 ENCOUNTER — Encounter
Admission: EM | Disposition: A | Discharge status: Home / Self Care | Payer: Self-pay | Source: Home / Self Care | Attending: Internal Medicine

## 2022-08-21 ENCOUNTER — Inpatient Hospital Stay
Admission: EM | Admit: 2022-08-21 | Discharge: 2022-08-23 | DRG: 322 | Disposition: A | Discharge status: Home / Self Care | Payer: No Typology Code available for payment source | Attending: Internal Medicine | Admitting: Internal Medicine

## 2022-08-21 DIAGNOSIS — E7800 Pure hypercholesterolemia, unspecified: Secondary | ICD-10-CM | POA: Diagnosis present

## 2022-08-21 DIAGNOSIS — K219 Gastro-esophageal reflux disease without esophagitis: Secondary | ICD-10-CM | POA: Diagnosis present

## 2022-08-21 DIAGNOSIS — Z87891 Personal history of nicotine dependence: Secondary | ICD-10-CM | POA: Diagnosis not present

## 2022-08-21 DIAGNOSIS — Z833 Family history of diabetes mellitus: Secondary | ICD-10-CM | POA: Diagnosis not present

## 2022-08-21 DIAGNOSIS — Z880 Allergy status to penicillin: Secondary | ICD-10-CM | POA: Diagnosis not present

## 2022-08-21 DIAGNOSIS — F32A Depression, unspecified: Secondary | ICD-10-CM | POA: Diagnosis present

## 2022-08-21 DIAGNOSIS — I213 ST elevation (STEMI) myocardial infarction of unspecified site: Principal | ICD-10-CM

## 2022-08-21 DIAGNOSIS — R739 Hyperglycemia, unspecified: Secondary | ICD-10-CM | POA: Diagnosis present

## 2022-08-21 DIAGNOSIS — F419 Anxiety disorder, unspecified: Secondary | ICD-10-CM | POA: Diagnosis present

## 2022-08-21 DIAGNOSIS — Z955 Presence of coronary angioplasty implant and graft: Secondary | ICD-10-CM | POA: Diagnosis not present

## 2022-08-21 DIAGNOSIS — I10 Essential (primary) hypertension: Secondary | ICD-10-CM | POA: Diagnosis present

## 2022-08-21 DIAGNOSIS — Z79899 Other long term (current) drug therapy: Secondary | ICD-10-CM | POA: Diagnosis not present

## 2022-08-21 DIAGNOSIS — I2510 Atherosclerotic heart disease of native coronary artery without angina pectoris: Secondary | ICD-10-CM | POA: Diagnosis present

## 2022-08-21 DIAGNOSIS — I2119 ST elevation (STEMI) myocardial infarction involving other coronary artery of inferior wall: Secondary | ICD-10-CM | POA: Diagnosis not present

## 2022-08-21 DIAGNOSIS — Z888 Allergy status to other drugs, medicaments and biological substances status: Secondary | ICD-10-CM

## 2022-08-21 HISTORY — PX: LEFT HEART CATH AND CORONARY ANGIOGRAPHY: CATH118249

## 2022-08-21 HISTORY — PX: CORONARY/GRAFT ACUTE MI REVASCULARIZATION: CATH118305

## 2022-08-21 LAB — GLUCOSE, CAPILLARY: Glucose-Capillary: 144 mg/dL — ABNORMAL HIGH (ref 70–99)

## 2022-08-21 LAB — CBC WITH DIFFERENTIAL/PLATELET
Abs Immature Granulocytes: 0.02 10*3/uL (ref 0.00–0.07)
Basophils Absolute: 0 10*3/uL (ref 0.0–0.1)
Basophils Relative: 0 %
Eosinophils Absolute: 0 10*3/uL (ref 0.0–0.5)
Eosinophils Relative: 0 %
HCT: 47.7 % (ref 39.0–52.0)
Hemoglobin: 14.9 g/dL (ref 13.0–17.0)
Immature Granulocytes: 0 %
Lymphocytes Relative: 30 %
Lymphs Abs: 2.1 10*3/uL (ref 0.7–4.0)
MCH: 25.3 pg — ABNORMAL LOW (ref 26.0–34.0)
MCHC: 31.2 g/dL (ref 30.0–36.0)
MCV: 80.8 fL (ref 80.0–100.0)
Monocytes Absolute: 0.4 10*3/uL (ref 0.1–1.0)
Monocytes Relative: 6 %
Neutro Abs: 4.4 10*3/uL (ref 1.7–7.7)
Neutrophils Relative %: 64 %
Platelets: 150 10*3/uL (ref 150–400)
RBC: 5.9 MIL/uL — ABNORMAL HIGH (ref 4.22–5.81)
RDW: 14.3 % (ref 11.5–15.5)
WBC: 6.9 10*3/uL (ref 4.0–10.5)
nRBC: 0 % (ref 0.0–0.2)

## 2022-08-21 LAB — COMPREHENSIVE METABOLIC PANEL
ALT: 23 U/L (ref 0–44)
AST: 40 U/L (ref 15–41)
Albumin: 4 g/dL (ref 3.5–5.0)
Alkaline Phosphatase: 63 U/L (ref 38–126)
Anion gap: 12 (ref 5–15)
BUN: 11 mg/dL (ref 6–20)
CO2: 17 mmol/L — ABNORMAL LOW (ref 22–32)
Calcium: 8.5 mg/dL — ABNORMAL LOW (ref 8.9–10.3)
Chloride: 106 mmol/L (ref 98–111)
Creatinine, Ser: 1.11 mg/dL (ref 0.61–1.24)
GFR, Estimated: >60 mL/min (ref >60)
Glucose, Bld: 196 mg/dL — ABNORMAL HIGH (ref 70–99)
Potassium: 4.7 mmol/L (ref 3.5–5.1)
Sodium: 135 mmol/L (ref 135–145)
Total Bilirubin: 1.6 mg/dL — ABNORMAL HIGH (ref 0.3–1.2)
Total Protein: 7.5 g/dL (ref 6.5–8.1)

## 2022-08-21 LAB — LIPID PANEL
Cholesterol: 246 mg/dL — ABNORMAL HIGH (ref 0–200)
HDL: 43 mg/dL (ref >40)
LDL Cholesterol: 181 mg/dL — ABNORMAL HIGH (ref 0–99)
Total CHOL/HDL Ratio: 5.7 RATIO
Triglycerides: 112 mg/dL (ref <150)
VLDL: 22 mg/dL (ref 0–40)

## 2022-08-21 LAB — TROPONIN I (HIGH SENSITIVITY)
Troponin I (High Sensitivity): 15 ng/L (ref <18)
Troponin I (High Sensitivity): 317 ng/L (ref <18)

## 2022-08-21 LAB — APTT: aPTT: 20 seconds — ABNORMAL LOW (ref 24–36)

## 2022-08-21 LAB — PROTIME-INR
INR: 1.1 (ref 0.8–1.2)
Prothrombin Time: 14 seconds (ref 11.4–15.2)

## 2022-08-21 LAB — POCT ACTIVATED CLOTTING TIME
Activated Clotting Time: 298 seconds
Activated Clotting Time: 412 seconds

## 2022-08-21 LAB — MRSA NEXT GEN BY PCR, NASAL: MRSA by PCR Next Gen: NOT DETECTED

## 2022-08-21 SURGERY — CORONARY/GRAFT ACUTE MI REVASCULARIZATION
Anesthesia: Moderate Sedation

## 2022-08-21 MED ORDER — ALPRAZOLAM 0.25 MG PO TABS
0.2500 mg | ORAL_TABLET | Freq: Two times a day (BID) | ORAL | Status: DC | PRN
  Administered 2022-08-21: 0.25 mg via ORAL
  Filled 2022-08-21: qty 1

## 2022-08-21 MED ORDER — NITROGLYCERIN 0.4 MG SL SUBL: 0.4000 mg | SUBLINGUAL_TABLET | SUBLINGUAL | Status: DC | PRN

## 2022-08-21 MED ORDER — HEPARIN SODIUM (PORCINE) 1000 UNIT/ML IJ SOLN
INTRAMUSCULAR | Status: DC | PRN
  Administered 2022-08-21 (×2): 5000 [IU] via INTRAVENOUS

## 2022-08-21 MED ORDER — METOPROLOL TARTRATE 25 MG PO TABS
25.0000 mg | ORAL_TABLET | Freq: Two times a day (BID) | ORAL | Status: DC
  Administered 2022-08-22 – 2022-08-23 (×2): 25 mg via ORAL
  Filled 2022-08-21 (×2): qty 1

## 2022-08-21 MED ORDER — ATROPINE SULFATE 1 MG/10ML IJ SOSY
PREFILLED_SYRINGE | INTRAMUSCULAR | Status: AC
  Filled 2022-08-21: qty 10

## 2022-08-21 MED ORDER — VERAPAMIL HCL 2.5 MG/ML IV SOLN
INTRAVENOUS | Status: AC
  Filled 2022-08-21: qty 2

## 2022-08-21 MED ORDER — ASPIRIN 81 MG PO CHEW
81.0000 mg | CHEWABLE_TABLET | Freq: Every day | ORAL | Status: DC
  Administered 2022-08-22 – 2022-08-23 (×2): 81 mg via ORAL
  Filled 2022-08-21 (×2): qty 1

## 2022-08-21 MED ORDER — LIDOCAINE HCL 1 % IJ SOLN
INTRAMUSCULAR | Status: AC
  Filled 2022-08-21: qty 20

## 2022-08-21 MED ORDER — ACETAMINOPHEN 325 MG PO TABS: 650.0000 mg | ORAL_TABLET | ORAL | Status: DC | PRN

## 2022-08-21 MED ORDER — TIROFIBAN HCL IN NACL 5-0.9 MG/100ML-% IV SOLN
0.1500 ug/kg/min | INTRAVENOUS | Status: AC
  Administered 2022-08-21 – 2022-08-22 (×2): 0.15 ug/kg/min via INTRAVENOUS
  Filled 2022-08-21 (×3): qty 100

## 2022-08-21 MED ORDER — PRASUGREL HCL 10 MG PO TABS
ORAL_TABLET | ORAL | Status: AC
  Filled 2022-08-21: qty 6

## 2022-08-21 MED ORDER — TIROFIBAN HCL IV 12.5 MG/250 ML
INTRAVENOUS | Status: AC
  Filled 2022-08-21: qty 250

## 2022-08-21 MED ORDER — HEPARIN (PORCINE) IN NACL 1000-0.9 UT/500ML-% IV SOLN
INTRAVENOUS | Status: AC
  Filled 2022-08-21: qty 1000

## 2022-08-21 MED ORDER — SODIUM CHLORIDE 0.9 % IV SOLN: INTRAVENOUS | Status: DC

## 2022-08-21 MED ORDER — HEPARIN SODIUM (PORCINE) 5000 UNIT/ML IJ SOLN: 60.0000 [IU]/kg | Freq: Once | INTRAMUSCULAR | Status: DC

## 2022-08-21 MED ORDER — ADENOSINE (DIAGNOSTIC) FOR INTRACORONARY USE
INTRAVENOUS | Status: DC | PRN
  Administered 2022-08-21: 96 ug via INTRACORONARY

## 2022-08-21 MED ORDER — HYDRALAZINE HCL 20 MG/ML IJ SOLN: 10.0000 mg | INTRAMUSCULAR | Status: AC | PRN

## 2022-08-21 MED ORDER — ADENOSINE 6 MG/2ML IV SOLN
INTRAVENOUS | Status: AC
  Filled 2022-08-21: qty 2

## 2022-08-21 MED ORDER — ONDANSETRON HCL 4 MG/2ML IJ SOLN
4.0000 mg | Freq: Four times a day (QID) | INTRAMUSCULAR | Status: DC | PRN
  Administered 2022-08-21: 4 mg via INTRAVENOUS
  Filled 2022-08-21: qty 2

## 2022-08-21 MED ORDER — PANTOPRAZOLE SODIUM 40 MG PO TBEC
40.0000 mg | DELAYED_RELEASE_TABLET | Freq: Every day | ORAL | Status: DC
  Administered 2022-08-21 – 2022-08-23 (×3): 40 mg via ORAL
  Filled 2022-08-21 (×3): qty 1

## 2022-08-21 MED ORDER — ENOXAPARIN SODIUM 40 MG/0.4ML IJ SOSY
40.0000 mg | PREFILLED_SYRINGE | INTRAMUSCULAR | Status: DC
  Administered 2022-08-21 – 2022-08-22 (×2): 40 mg via SUBCUTANEOUS
  Filled 2022-08-21 (×2): qty 0.4

## 2022-08-21 MED ORDER — FENTANYL CITRATE (PF) 100 MCG/2ML IJ SOLN
INTRAMUSCULAR | Status: AC
  Filled 2022-08-21: qty 2

## 2022-08-21 MED ORDER — ASPIRIN 81 MG PO CHEW
324.0000 mg | CHEWABLE_TABLET | Freq: Once | ORAL | Status: DC
  Filled 2022-08-21: qty 4

## 2022-08-21 MED ORDER — LABETALOL HCL 5 MG/ML IV SOLN: 10.0000 mg | INTRAVENOUS | Status: AC | PRN

## 2022-08-21 MED ORDER — LIDOCAINE HCL (PF) 1 % IJ SOLN
INTRAMUSCULAR | Status: DC | PRN
  Administered 2022-08-21: 5 mL via SUBCUTANEOUS

## 2022-08-21 MED ORDER — SODIUM CHLORIDE 0.9% FLUSH: 3.0000 mL | INTRAVENOUS | Status: DC | PRN

## 2022-08-21 MED ORDER — ASPIRIN 81 MG PO TBEC: 81.0000 mg | DELAYED_RELEASE_TABLET | Freq: Every day | ORAL | Status: DC

## 2022-08-21 MED ORDER — VERAPAMIL HCL 2.5 MG/ML IV SOLN
INTRAVENOUS | Status: DC | PRN
  Administered 2022-08-21: 2.5 mg via INTRAVENOUS

## 2022-08-21 MED ORDER — MORPHINE SULFATE (PF) 4 MG/ML IV SOLN
4.0000 mg | INTRAVENOUS | Status: DC | PRN
  Administered 2022-08-21: 4 mg via INTRAVENOUS
  Filled 2022-08-21: qty 1

## 2022-08-21 MED ORDER — PRASUGREL HCL 10 MG PO TABS
10.0000 mg | ORAL_TABLET | Freq: Every day | ORAL | Status: DC
  Administered 2022-08-22 – 2022-08-23 (×2): 10 mg via ORAL
  Filled 2022-08-21 (×2): qty 1

## 2022-08-21 MED ORDER — NITROGLYCERIN IN D5W 200-5 MCG/ML-% IV SOLN
0.0000 ug/min | INTRAVENOUS | Status: DC
  Administered 2022-08-21: 5 ug/min via INTRAVENOUS
  Filled 2022-08-21: qty 250

## 2022-08-21 MED ORDER — TIROFIBAN (AGGRASTAT) BOLUS VIA INFUSION
INTRAVENOUS | Status: DC | PRN
  Administered 2022-08-21: 2325 ug via INTRAVENOUS

## 2022-08-21 MED ORDER — PRASUGREL HCL 10 MG PO TABS
ORAL_TABLET | ORAL | Status: DC | PRN
  Administered 2022-08-21: 60 mg via ORAL

## 2022-08-21 MED ORDER — SODIUM CHLORIDE 0.9 % WEIGHT BASED INFUSION
1.0000 mL/kg/h | INTRAVENOUS | Status: AC
  Administered 2022-08-21 – 2022-08-22 (×2): 1 mL/kg/h via INTRAVENOUS

## 2022-08-21 MED ORDER — ONDANSETRON HCL 4 MG/2ML IJ SOLN: 4.0000 mg | Freq: Four times a day (QID) | INTRAMUSCULAR | Status: DC | PRN

## 2022-08-21 MED ORDER — FENTANYL CITRATE (PF) 100 MCG/2ML IJ SOLN
INTRAMUSCULAR | Status: DC | PRN
  Administered 2022-08-21 (×5): 25 ug via INTRAVENOUS

## 2022-08-21 MED ORDER — PROCHLORPERAZINE EDISYLATE 10 MG/2ML IJ SOLN
10.0000 mg | Freq: Once | INTRAMUSCULAR | Status: AC
  Administered 2022-08-21: 10 mg via INTRAVENOUS
  Filled 2022-08-21: qty 2

## 2022-08-21 MED ORDER — MIDAZOLAM HCL 2 MG/2ML IJ SOLN
INTRAMUSCULAR | Status: AC
  Filled 2022-08-21: qty 2

## 2022-08-21 MED ORDER — ATORVASTATIN CALCIUM 80 MG PO TABS
80.0000 mg | ORAL_TABLET | Freq: Every day | ORAL | Status: DC
  Administered 2022-08-21 – 2022-08-23 (×3): 80 mg via ORAL
  Filled 2022-08-21 (×2): qty 1
  Filled 2022-08-21: qty 4
  Filled 2022-08-21: qty 1

## 2022-08-21 MED ORDER — MIDAZOLAM HCL 2 MG/2ML IJ SOLN
INTRAMUSCULAR | Status: DC | PRN
  Administered 2022-08-21 (×3): 1 mg via INTRAVENOUS

## 2022-08-21 MED ORDER — HEPARIN (PORCINE) IN NACL 2000-0.9 UNIT/L-% IV SOLN
INTRAVENOUS | Status: DC | PRN
  Administered 2022-08-21: 1000 mL

## 2022-08-21 MED ORDER — SODIUM CHLORIDE 0.9% FLUSH
3.0000 mL | Freq: Two times a day (BID) | INTRAVENOUS | Status: DC
  Administered 2022-08-22 (×2): 3 mL via INTRAVENOUS

## 2022-08-21 MED ORDER — TIROFIBAN HCL IV 12.5 MG/250 ML
INTRAVENOUS | Status: AC | PRN
  Administered 2022-08-21: .15 ug/kg/min via INTRAVENOUS

## 2022-08-21 MED ORDER — HEPARIN SODIUM (PORCINE) 1000 UNIT/ML IJ SOLN
INTRAMUSCULAR | Status: AC
  Filled 2022-08-21: qty 10

## 2022-08-21 MED ORDER — SODIUM CHLORIDE 0.9 % IV SOLN: 250.0000 mL | INTRAVENOUS | Status: DC | PRN

## 2022-08-21 MED ORDER — PRASUGREL HCL 10 MG PO TABS
ORAL_TABLET | ORAL | Status: AC
  Filled 2022-08-21: qty 1

## 2022-08-21 MED ORDER — IOHEXOL 300 MG/ML  SOLN
INTRAMUSCULAR | Status: DC | PRN
  Administered 2022-08-21: 230 mL

## 2022-08-21 SURGICAL SUPPLY — 23 items
BALLN MINITREK RX 2.0X15 (BALLOONS) ×1
BALLN TREK RX 2.25X20 (BALLOONS) ×1
BALLOON MINITREK RX 2.0X15 (BALLOONS) IMPLANT
BALLOON TREK RX 2.25X20 (BALLOONS) IMPLANT
CATH INFINITI 5 FR JL3.5 (CATHETERS) IMPLANT
CATH INFINITI 5FR ANG PIGTAIL (CATHETERS) IMPLANT
CATH VISTA GUIDE 6FR JR4 (CATHETERS) IMPLANT
DEVICE RAD TR BAND REGULAR (VASCULAR PRODUCTS) IMPLANT
DRAPE BRACHIAL (DRAPES) IMPLANT
GLIDESHEATH SLEND SS 6F .021 (SHEATH) IMPLANT
GUIDEWIRE INQWIRE 1.5J.035X260 (WIRE) IMPLANT
INQWIRE 1.5J .035X260CM (WIRE) ×1
KIT ENCORE 26 ADVANTAGE (KITS) IMPLANT
PACK CARDIAC CATH (CUSTOM PROCEDURE TRAY) ×2 IMPLANT
PROTECTION STATION PRESSURIZED (MISCELLANEOUS) ×1
SET ATX-X65L (MISCELLANEOUS) IMPLANT
STATION PROTECTION PRESSURIZED (MISCELLANEOUS) IMPLANT
STENT ONYX FRONTIER 2.25X30 (Permanent Stent) IMPLANT
TUBING CIL FLEX 10 FLL-RA (TUBING) IMPLANT
WIRE ASAHI PROWATER 180CM (WIRE) IMPLANT
WIRE EMERALD 3MM-J .035X150CM (WIRE) IMPLANT
WIRE G HI TQ BMW 190 (WIRE) IMPLANT
WIRE RUNTHROUGH .014X180CM (WIRE) IMPLANT

## 2022-08-21 NOTE — Assessment & Plan Note (Signed)
Protonix.  ?

## 2022-08-21 NOTE — Assessment & Plan Note (Signed)
Continue current orders per cardiology which include Aggrastat, labetalol, hydralazine, atorvastatin high-dose, chewable aspirin Per Dr. Darrold Junker, losartan can be added to replace prior lisinopril Echocardiogram has been ordered by Dr. Darrold Junker

## 2022-08-21 NOTE — H&P (Addendum)
History and Physical    Patient: Alexander Greer:[id number] DOB: 09-May-1965 DOA: 08/21/2022 DOS: the patient was seen and examined on 08/21/2022 PCP: Pcp, No  Patient coming from: Home  Chief Complaint:  Chief Complaint  Patient presents with   Chest Pain   HPI: Alexander GUIRGUIS is a 57 y.o. male with medical history significant of Anxiety, GERD, former smoker, ACE inhibitor intolerant for cough, p brought in by EMS with an inferior STEMI and taken immediately to the Cath Lab where he was found to have an occluded distal RCA undergoing stent placement with near resolution of chest pain.  He is being admitted to the hospitalist service s/p PCI.  He states his pain is down to a 1 out of 2 but he complains of feeling anxious.  He denies shortness of breath or palpitations. Since arrival to the stepdown unit, his vitals have been stable.  His first troponin was 15, total cholesterol 246 with LDL 181, blood glucose 196, CBC unremarkable.  Review of Systems: As mentioned in the history of present illness. All other systems reviewed and are negative. Past Medical History:  Diagnosis Date   Anxiety    Depression    GERD (gastroesophageal reflux disease)    Impotence of organic origin    PVC (premature ventricular contraction)    Snoring    Uncomplicated herpes simplex    Past Surgical History:  Procedure Laterality Date   Diverticulitis      ESOPHAGOGASTRODUODENOSCOPY (EGD) WITH PROPOFOL N/A 12/04/2021   Procedure: ESOPHAGOGASTRODUODENOSCOPY (EGD) WITH PROPOFOL;  Surgeon: Regis Bill, MD;  Location: [facility] ENDOSCOPY;  Service: Endoscopy;  Laterality: N/A;   PARTIAL COLECTOMY  04/12/2004   due to diverticular abscess   Social History:  reports that he has quit smoking. His smoking use included cigarettes. He has never used smokeless tobacco. He reports current alcohol use of about 6.0 standard drinks of alcohol per week. He reports that he does not use drugs.  Allergies  Allergen  Reactions   Ace Inhibitors Cough   Penicillins     Family History  Problem Relation Age of Onset   Diabetes Mellitus II Mother    Healthy Sister     Prior to Admission medications   Medication Sig Start Date End Date Taking? Authorizing Provider  Aloe Vera (ALOE VERA MOISTURIZING) 99.5 % GEL Take by mouth.    [provider]  fluconazole (DIFLUCAN) 150 MG tablet Take 1 tablet (150 mg total) by mouth as directed. May repeat dose on day 4 if needed. 06/22/19   Flinchum, Eula Fried, FNP  FLUoxetine HCl (PROZAC PO) Take by mouth.    [provider]  hydrOXYzine (ATARAX) 25 MG tablet Take 1 tablet (25 mg total) by mouth every 6 (six) hours. 05/04/22   Becky Augusta, NP  lisinopril (ZESTRIL) 5 MG tablet Take 1 tablet (5 mg total) by mouth daily. 03/21/20 03/21/21  Merwyn Katos, MD  pantoprazole (PROTONIX) 40 MG tablet Take 1 tablet (40 mg total) by mouth daily. 06/20/19   Flinchum, Eula Fried, FNP  tadalafil (CIALIS) 5 MG tablet Take 1 tablet (5 mg total) by mouth every other day as needed for erectile dysfunction (inform all healthcare providers of use). 10/10/19   Flinchum, Eula Fried, FNP  valACYclovir (VALTREX) 1000 MG tablet Take 1 tablet (1,000 mg total) by mouth daily. Start at first sign of symptom onset. Use for 5 days. 02/06/20   Flinchum, Eula Fried, FNP  Vitamin D, Ergocalciferol, (DRISDOL) 1.25 MG (  50000 UNIT) CAPS capsule Take 1 capsule (50,000 Units total) by mouth every 7 (seven) days. (ONCE weekly) Please come to lab walk in to have ordered labs done. 10/10/19   Berniece Pap, FNP    Physical Exam: Vitals:   08/21/22 2030 08/21/22 2040 08/21/22 2045 08/21/22 2050  BP: (!) 141/86 126/86 127/75 114/81  Pulse: (!) 55 64 67 65  Resp: 13 (!) 22 11 15   Temp:      TempSrc:      SpO2: 99% 99% 98% 97%  Weight:      Height:       Physical Exam Vitals and nursing note reviewed.  Constitutional:      General: He is not in acute distress. HENT:     Head:  Normocephalic and atraumatic.  Cardiovascular:     Rate and Rhythm: Normal rate and regular rhythm.     Heart sounds: Normal heart sounds.  Pulmonary:     Effort: Pulmonary effort is normal.     Breath sounds: Normal breath sounds.  Abdominal:     Palpations: Abdomen is soft.     Tenderness: There is no abdominal tenderness.  Neurological:     Mental Status: Mental status is at baseline.     Data Reviewed: Relevant notes from primary care and specialist visits, past discharge summaries as available in EHR, including Care Everywhere. Prior diagnostic testing as pertinent to current admission diagnoses Updated medications and problem lists for reconciliation ED course, including vitals, labs, imaging, treatment and response to treatment Triage notes, nursing and pharmacy notes and ED provider's notes Notable results as noted in HPI   Assessment and Plan: * STEMI involving oth coronary artery of inferior wall (HCC) Continue current orders per cardiology which include Aggrastat, labetalol, hydralazine, atorvastatin high-dose, chewable aspirin Per Dr. Darrold Junker, losartan can be added to replace prior lisinopril Echocardiogram has been ordered by Dr. Darrold Junker  Elevated blood sugar level Blood sugar 186 Will get A1c to screen for diabetes   HTN (hypertension) History of ACE inhibitor allergy-cough Losartan to replace prior lisinopril which gave patient a cough  Gastroesophageal reflux disease Protonix  Anxiety disorder Xanax nightly as needed      Advance Care Planning:   Code Status: Full Code   Consults: Cardiology, [facility] Dr. Mariah Milling  Family Communication:   Severity of Illness: The appropriate patient status for this patient is INPATIENT. Inpatient status is judged to be reasonable and necessary in order to provide the required intensity of service to ensure the patient's safety. The patient's presenting symptoms, physical exam findings, and initial radiographic and  laboratory data in the context of their chronic comorbidities is felt to place them at high risk for further clinical deterioration. Furthermore, it is not anticipated that the patient will be medically stable for discharge from the hospital within 2 midnights of admission.   * I certify that at the point of admission it is my clinical judgment that the patient will require inpatient hospital care spanning beyond 2 midnights from the point of admission due to high intensity of service, high risk for further deterioration and high frequency of surveillance required.*  Author: Andris Baumann, MD 08/21/2022 8:59 PM  For on call review [email or web address].

## 2022-08-21 NOTE — ED Notes (Signed)
RN Marylene Land attempted IV access x1, RN Gerilyn Pilgrim attempted IV access x1. Both unsuccessful.

## 2022-08-21 NOTE — ED Notes (Addendum)
RN Marylene Land and RN Gerilyn Pilgrim at bedside.

## 2022-08-21 NOTE — ED Provider Notes (Signed)
[facility] Provider Note    None    (approximate)   History   Chest Pain   HPI  Alexander Greer is a 57 y.o. male presents to the ER for evaluation of midsternal nonradiating chest pain and pressure that occurred roughly 2 hours prior to arrival while sitting down.  Does have a history of high blood pressure high cholesterol not on any blood thinners.  No previous history of heart attack does not smoke.     Physical Exam   Triage Vital Signs: ED Triage Vitals  Enc Vitals Group     BP      Pulse      Resp      Temp      Temp src      SpO2      Weight      Height      Head Circumference      Peak Flow      Pain Score      Pain Loc      Pain Edu?      Excl. in GC?     Most recent vital signs: Vitals:   08/21/22 1748  BP: (!) 117/91  Pulse: 69  Resp: 16  Temp: (!) 97.5 F (36.4 C)  SpO2: 100%     Constitutional: Alert  Eyes: Conjunctivae are normal.  Head: Atraumatic. Nose: No congestion/rhinnorhea. Mouth/Throat: Mucous membranes are moist.   Neck: Painless ROM.  Cardiovascular:   Good peripheral circulation. Respiratory: Normal respiratory effort.  No retractions.  Gastrointestinal: Soft and nontender.  Musculoskeletal:  no deformity Neurologic:  MAE spontaneously. No gross focal neurologic deficits are appreciated.  Skin:  Skin is warm, dry and intact. No rash noted. Psychiatric: Mood and affect are normal. Speech and behavior are normal.    ED Results / Procedures / Treatments   Labs (all labs ordered are listed, but only abnormal results are displayed) Labs Reviewed  CBC WITH DIFFERENTIAL/PLATELET  PROTIME-INR  APTT  COMPREHENSIVE METABOLIC PANEL  LIPID PANEL  HEMOGLOBIN A1C  TROPONIN I (HIGH SENSITIVITY)     EKG  ED ECG REPORT I, Willy Eddy, the attending physician, personally viewed and interpreted this ECG.   Date: 08/21/2022  EKG Time: 17:49  Rate: 60  Rhythm: sinus  Axis: normal   Intervals: normal  ST&T Change: inferior stemi       PROCEDURES:  Critical Care performed: Yes, see critical care procedure note(s)  .Critical Care  Performed by: Willy Eddy, MD Authorized by: Willy Eddy, MD   Critical care provider statement:    Critical care time (minutes):  15   Critical care was necessary to treat or prevent imminent or life-threatening deterioration of the following conditions:  Cardiac failure   Critical care was time spent personally by me on the following activities:  Ordering and performing treatments and interventions, ordering and review of laboratory studies, ordering and review of radiographic studies, pulse oximetry, re-evaluation of patient's condition, review of old charts, obtaining history from patient or surrogate, examination of patient, evaluation of patient's response to treatment, discussions with primary provider, discussions with consultants and development of treatment plan with patient or surrogate    MEDICATIONS ORDERED IN ED: Medications  0.9 %  sodium chloride infusion (has no administration in time range)  aspirin chewable tablet 324 mg (has no administration in time range)  morphine (PF) 4 MG/ML injection 4 mg (has no administration in time range)     IMPRESSION /  MDM / ASSESSMENT AND PLAN / ED COURSE  I reviewed the triage vital signs and the nursing notes.                              Differential diagnosis includes, but is not limited to, .ACS, pericarditis, esophagitis, boerhaaves, pe, dissection, pna, bronchitis, costochondritis  Patient presenting to the ER for evaluation of symptoms as described above.  Based on symptoms, risk factors and considered above differential, this presenting complaint could reflect a potentially life-threatening illness therefore the patient will be placed on continuous pulse oximetry and telemetry for monitoring.  Laboratory evaluation will be sent to evaluate for the above  complaints.  Dr. Marina Goodell shows of interventional cardiology present at bedside immediately.  Plan will take patient to cardiac cath for further intervention and management.  Patient protecting his airway.  Repeat EKG confirms inferior STEMI.  Heparin will be initiated in Cath Lab.  Patient stable for transfer to Cath Lab.       FINAL CLINICAL IMPRESSION(S) / ED DIAGNOSES   Final diagnoses:  ST elevation myocardial infarction (STEMI), unspecified artery (HCC)     Rx / DC Orders   ED Discharge Orders     None        Note:  This document was prepared using Dragon voice recognition software and may include unintentional dictation errors.    Willy Eddy, MD 08/21/22 [phone]

## 2022-08-21 NOTE — ED Notes (Signed)
Pt taken to cath lab

## 2022-08-21 NOTE — Consult Note (Signed)
[facility] Cardiology  CARDIOLOGY CONSULT NOTE  Patient ID: Alexander Greer MRN: [id number] DOB/AGE: 07/20/1965 57 y.o.  Admit date: 08/21/2022 Referring Physician Roxan Hockey Primary Physician  Primary Cardiologist  Reason for Consultation inferior STEMI  HPI: 57 year old gentleman referred for evaluation of inferior STEMI.  Patient has history of essential hypertension no longer on medications.  He was in his usual state of health until today when he experienced substernal chest pain x 2 hours.  EMS was called and initial EKG was diagnostic for inferior STEMI.  Patient was brought to the cardiac catheterization laboratory which revealed occluded distal RCA.  The patient underwent primary PCI with 2.25 x 30 mm Onyx Frontier DES distal RCA, with near resolution of chest pain.  Small caliber PL branch was jailed by stent, with initial occlusion, and restoration of flow after running Aggrastat drip.  Review of systems complete and found to be negative unless listed above     Past Medical History:  Diagnosis Date   Anxiety    Depression    GERD (gastroesophageal reflux disease)    Impotence of organic origin    PVC (premature ventricular contraction)    Snoring    Uncomplicated herpes simplex     Past Surgical History:  Procedure Laterality Date   Diverticulitis      ESOPHAGOGASTRODUODENOSCOPY (EGD) WITH PROPOFOL N/A 12/04/2021   Procedure: ESOPHAGOGASTRODUODENOSCOPY (EGD) WITH PROPOFOL;  Surgeon: Regis Bill, MD;  Location: [facility] ENDOSCOPY;  Service: Endoscopy;  Laterality: N/A;   PARTIAL COLECTOMY  04/12/2004   due to diverticular abscess    Medications Prior to Admission  Medication Sig Dispense Refill Last Dose   Aloe Vera (ALOE VERA MOISTURIZING) 99.5 % GEL Take by mouth.      fluconazole (DIFLUCAN) 150 MG tablet Take 1 tablet (150 mg total) by mouth as directed. May repeat dose on day 4 if needed. 2 tablet 0    FLUoxetine HCl (PROZAC PO) Take by mouth.      hydrOXYzine (ATARAX)  25 MG tablet Take 1 tablet (25 mg total) by mouth every 6 (six) hours. 12 tablet 0    lisinopril (ZESTRIL) 5 MG tablet Take 1 tablet (5 mg total) by mouth daily. 30 tablet 11    pantoprazole (PROTONIX) 40 MG tablet Take 1 tablet (40 mg total) by mouth daily. 90 tablet 0    tadalafil (CIALIS) 5 MG tablet Take 1 tablet (5 mg total) by mouth every other day as needed for erectile dysfunction (inform all healthcare providers of use). 10 tablet 0    valACYclovir (VALTREX) 1000 MG tablet Take 1 tablet (1,000 mg total) by mouth daily. Start at first sign of symptom onset. Use for 5 days. 90 tablet 1    Vitamin D, Ergocalciferol, (DRISDOL) 1.25 MG (50000 UNIT) CAPS capsule Take 1 capsule (50,000 Units total) by mouth every 7 (seven) days. (ONCE weekly) Please come to lab walk in to have ordered labs done. 7 capsule 0    Social History   Socioeconomic History   Marital status: Divorced    Spouse name: Not on file   Number of children: Not on file   Years of education: Not on file   Highest education level: Not on file  Occupational History   Not on file  Tobacco Use   Smoking status: Former    Types: Cigarettes   Smokeless tobacco: Never   Tobacco comments:    Quit smoking in his 77s  Vaping Use   Vaping Use: Never used  Substance  and Sexual Activity   Alcohol use: Yes    Alcohol/week: 6.0 standard drinks of alcohol    Types: 6 Cans of beer per week   Drug use: No   Sexual activity: Yes  Other Topics Concern   Not on file  Social History Narrative   Not on file   Social Determinants of Health   Financial Resource Strain: Not on file  Food Insecurity: Not on file  Transportation Needs: Not on file  Physical Activity: Not on file  Stress: Not on file  Social Connections: Not on file  Intimate Partner Violence: Not on file    Family History  Problem Relation Age of Onset   Diabetes Mellitus II Mother    Healthy Sister       Review of systems complete and found to be negative  unless listed above      PHYSICAL EXAM  General: Well developed, well nourished, in no acute distress HEENT:  Normocephalic and atramatic Neck:  No JVD.  Lungs: Clear bilaterally to auscultation and percussion. Heart: HRRR . Normal S1 and S2 without gallops or murmurs.  Abdomen: Bowel sounds are positive, abdomen soft and non-tender  Msk:  Back normal, normal gait. Normal strength and tone for age. Extremities: No clubbing, cyanosis or edema.   Neuro: Alert and oriented X 3. Psych:  Good affect, responds appropriately  Labs:   Lab Results  Component Value Date   WBC 6.9 08/21/2022   HGB 14.9 08/21/2022   HCT 47.7 08/21/2022   MCV 80.8 08/21/2022   PLT 150 08/21/2022    Recent Labs  Lab 08/21/22 1801  NA 135  K 4.7  CL 106  CO2 17*  BUN 11  CREATININE 1.11  CALCIUM 8.5*  PROT 7.5  BILITOT 1.6*  ALKPHOS 63  ALT 23  AST 40  GLUCOSE 196*   No results found for: "CKTOTAL", "CKMB", "CKMBINDEX", "TROPONINI"  Lab Results  Component Value Date   CHOL 246 (H) 08/21/2022   CHOL 228 (H) 06/20/2019   Lab Results  Component Value Date   HDL 43 08/21/2022   HDL 50 06/20/2019   Lab Results  Component Value Date   LDLCALC 181 (H) 08/21/2022   LDLCALC 161 (H) 06/20/2019   Lab Results  Component Value Date   TRIG 112 08/21/2022   TRIG 98 06/20/2019   Lab Results  Component Value Date   CHOLHDL 5.7 08/21/2022   No results found for: "LDLDIRECT"    Radiology: CARDIAC CATHETERIZATION  Result Date: 08/21/2022   Prox LAD to Mid LAD lesion is 50% stenosed.   Prox Cx lesion is 50% stenosed.   Mid LAD lesion is 50% stenosed.   Prox RCA to Mid RCA lesion is 40% stenosed.   Dist RCA lesion is 100% stenosed.   A stent was successfully placed using a STENT ONYX FRONTIER 2.25X30.   Post intervention, there is a 0% residual stenosis.   The left ventricular systolic function is normal.   The left ventricular ejection fraction is 50-55% by visual estimate. 1.  Acute inferior  STEMI 2.  Occluded distal RCA 3.  Successful primary PCI with 2.5 x 30 mm Onyx frontier DES distal RCA 4.  Preserved left ventricular function with estimated LVEF 50-55% with mild inferior hypokinesis Recommendations 1.  Dual antiplatelet therapy uninterrupted x 1 year 2.  Aggrastat drip x 18 hours 3.  Start high intensity atorvastatin 80 mg daily 4.  Start metoprolol to tartrate 25 mg twice daily 5.  2D echocardiogram 6.  Start ACEI/ARB as blood pressure permits    EKG: Sinus rhythm with ST elevations leads II, III and aVF  ASSESSMENT AND PLAN:   1.  Acute inferior STEMI, successful primary PCI with 2.25 x 30 mm Onyx Frontier DES distal RCA.  Small caliber PL branch jailed by stent with restoration of flow after starting Aggrastat drip. 2.  Preserved left ventricular function with estimated LV ejection fraction 50-55% 3.  Essential hypertension  Recommendations  1.  Continue dual antiplatelet therapy uninterrupted x 1 year 2.  Continue Aggrastat x 18 hours 3.  Start high intensity atorvastatin 80 mg daily 4.  Start metoprolol tartrate 25 mg twice daily 5.  Start ACEI/ARB as blood pressure permits 5.  2D echocardiogram   Signed: Marcina Millard MD,PhD, [facility] 08/21/2022, 7:45 PM

## 2022-08-21 NOTE — Assessment & Plan Note (Addendum)
Blood sugar 186 A1c 6 Outpatient follow-up with PCP for further evaluation

## 2022-08-21 NOTE — ED Triage Notes (Signed)
Pt to ED from Home for CP, pt states it started 2 hours ago. Pt wasn't doing anything when pain started. 324 Asprin given, 4mg  zofran, received about NS, en route. Pt states 10/10 central chest pain.  100% RA 25 rr 103/60 259 Cbg

## 2022-08-21 NOTE — ED Notes (Signed)
Cardiologist at bedside.  

## 2022-08-21 NOTE — Assessment & Plan Note (Signed)
History of ACE inhibitor allergy-cough Losartan to replace prior lisinopril which gave patient a cough

## 2022-08-21 NOTE — Progress Notes (Signed)
  Chaplain On-Call responded to Code STEMI notification at 1734 hours.  The patient was in ED room 16; was assessed quickly, and was transported to the Cath Lab.  At 1805 hours, this Chaplain was paged to escort the patient's Alexander Greer, Alexander Greer, from the ED Lobby to the Cath Lab Waiting Room.  Chaplain provided spiritual and emotional support.  Chaplain Evelena Peat M.Div., [facility]

## 2022-08-21 NOTE — Assessment & Plan Note (Signed)
Xanax nightly as needed.

## 2022-08-22 DIAGNOSIS — I2119 ST elevation (STEMI) myocardial infarction involving other coronary artery of inferior wall: Secondary | ICD-10-CM | POA: Diagnosis not present

## 2022-08-22 DIAGNOSIS — R739 Hyperglycemia, unspecified: Secondary | ICD-10-CM | POA: Diagnosis not present

## 2022-08-22 DIAGNOSIS — I10 Essential (primary) hypertension: Secondary | ICD-10-CM

## 2022-08-22 DIAGNOSIS — K219 Gastro-esophageal reflux disease without esophagitis: Secondary | ICD-10-CM

## 2022-08-22 DIAGNOSIS — I213 ST elevation (STEMI) myocardial infarction of unspecified site: Secondary | ICD-10-CM

## 2022-08-22 DIAGNOSIS — Z955 Presence of coronary angioplasty implant and graft: Secondary | ICD-10-CM

## 2022-08-22 LAB — CBC
HCT: 40.7 % (ref 39.0–52.0)
Hemoglobin: 12.7 g/dL — ABNORMAL LOW (ref 13.0–17.0)
MCH: 25.2 pg — ABNORMAL LOW (ref 26.0–34.0)
MCHC: 31.2 g/dL (ref 30.0–36.0)
MCV: 80.9 fL (ref 80.0–100.0)
Platelets: 178 10*3/uL (ref 150–400)
RBC: 5.03 MIL/uL (ref 4.22–5.81)
RDW: 14.1 % (ref 11.5–15.5)
WBC: 6.8 10*3/uL (ref 4.0–10.5)
nRBC: 0 % (ref 0.0–0.2)

## 2022-08-22 LAB — BASIC METABOLIC PANEL
Anion gap: 5 (ref 5–15)
BUN: 14 mg/dL (ref 6–20)
CO2: 25 mmol/L (ref 22–32)
Calcium: 8.1 mg/dL — ABNORMAL LOW (ref 8.9–10.3)
Chloride: 105 mmol/L (ref 98–111)
Creatinine, Ser: 0.84 mg/dL (ref 0.61–1.24)
GFR, Estimated: >60 mL/min (ref >60)
Glucose, Bld: 136 mg/dL — ABNORMAL HIGH (ref 70–99)
Potassium: 4 mmol/L (ref 3.5–5.1)
Sodium: 135 mmol/L (ref 135–145)

## 2022-08-22 LAB — HEMOGLOBIN A1C
Hgb A1c MFr Bld: 6 % — ABNORMAL HIGH (ref 4.8–5.6)
Mean Plasma Glucose: 125.5 mg/dL

## 2022-08-22 LAB — HIV ANTIBODY (ROUTINE TESTING W REFLEX): HIV Screen 4th Generation wRfx: NONREACTIVE

## 2022-08-22 LAB — MAGNESIUM: Magnesium: 1.8 mg/dL (ref 1.7–2.4)

## 2022-08-22 MED ORDER — CHLORHEXIDINE GLUCONATE CLOTH 2 % EX PADS
6.0000 | MEDICATED_PAD | Freq: Every day | CUTANEOUS | Status: DC
  Administered 2022-08-22: 6 via TOPICAL

## 2022-08-22 MED ORDER — FLUOXETINE HCL 20 MG PO CAPS
20.0000 mg | ORAL_CAPSULE | Freq: Every day | ORAL | Status: DC
  Administered 2022-08-22 – 2022-08-23 (×2): 20 mg via ORAL
  Filled 2022-08-22 (×2): qty 1

## 2022-08-22 MED ORDER — MAGNESIUM SULFATE 2 GM/50ML IV SOLN
2.0000 g | Freq: Once | INTRAVENOUS | Status: AC
  Administered 2022-08-22: 2 g via INTRAVENOUS
  Filled 2022-08-22: qty 50

## 2022-08-22 NOTE — Progress Notes (Signed)
  Progress Note   Patient: Alexander Greer VWU:[id number] DOB: 21-Dec-1965 DOA: 08/21/2022     1 DOS: the patient was seen and examined on 08/22/2022   Brief hospital course: 57 y.o. male with medical history significant of Anxiety, GERD, former smoker, ACE inhibitor intolerant for cough, p brought in by EMS with an inferior STEMI   5/11: taken immediately to the Cath Lab where he was found to have an occluded distal RCA undergoing stent placement with near resolution of chest pain. admitted to the hospitalist service s/p PCI   5/12: Transfer to PCU, possible discharge in a.m.  Assessment and Plan: * STEMI involving oth coronary artery of inferior wall (HCC) Continue atorvastatin high-dose, chewable aspirin, metoprolol.  Dual antiplatelet therapy uninterrupted for 1 year Per Dr. Darrold Junker, losartan can be added to replace prior lisinopril if blood pressure tolerates Echocardiogram pending Transfer to PCU  Elevated blood sugar level Blood sugar 186 A1c 6 Outpatient follow-up with PCP for further evaluation   HTN (hypertension) History of ACE inhibitor allergy-cough Losartan to replace prior lisinopril which gave patient a cough if blood pressure tolerates Continue metoprolol   Gastroesophageal reflux disease Continue Protonix  Anxiety disorder Xanax nightly as needed.        Subjective: Feeling much better.  No chest pain or shortness of breath  Physical Exam: Vitals:   08/22/22 1000 08/22/22 1100 08/22/22 1200 08/22/22 1300  BP: 101/74 (!) 103/56 (!) 93/51 107/78  Pulse: 62 (!) 56 (!) 58 63  Resp: 19 18 (!) 22 13  Temp:   97.8 F (36.6 C)   TempSrc:   Axillary   SpO2: 96% 97% 95% 94%  Weight:      Height:       57 year old male lying in the bed comfortably without any acute distress Lungs clear to auscultation bilaterally Heart regular rate and rhythm Abdomen soft, benign Neuro alert and awake, nonfocal Skin no rash or lesion Psych normal mood and  affect Data Reviewed:  There are no new results to review at this time.  Family Communication: None at bedside  Disposition: Status is: Inpatient Remains inpatient appropriate because: Management of STEMI  Planned Discharge Destination: Home   DVT prophylaxis-Lovenox Time spent: 35 minutes  Author: Delfino Lovett, MD 08/22/2022 2:40 PM  For on call review [email or web address].

## 2022-08-22 NOTE — Progress Notes (Signed)
[facility] Cardiology  SUBJECTIVE: Patient laying in bed, denies chest pain or shortness of breath   Vitals:   08/22/22 0530 08/22/22 0600 08/22/22 0630 08/22/22 0700  BP: 100/63 107/66 105/67 (!) 91/56  Pulse: (!) 56 (!) 55 (!) 57 (!) 57  Resp: 19 19 20 19   Temp:      TempSrc:      SpO2: 98% 98% 98% 97%  Weight:      Height:         Intake/Output Summary (Last 24 hours) at 08/22/2022 0835 Last data filed at 08/22/2022 0700 Gross per 24 hour  Intake 1151.68 ml  Output 1200 ml  Net -48.32 ml      PHYSICAL EXAM  General: Well developed, well nourished, in no acute distress HEENT:  Normocephalic and atramatic Neck:  No JVD.  Lungs: Clear bilaterally to auscultation and percussion. Heart: HRRR . Normal S1 and S2 without gallops or murmurs.  Abdomen: Bowel sounds are positive, abdomen soft and non-tender  Msk:  Back normal, normal gait. Normal strength and tone for age. Extremities: No clubbing, cyanosis or edema.   Neuro: Alert and oriented X 3. Psych:  Good affect, responds appropriately   LABS: Basic Metabolic Panel: Recent Labs    08/21/22 1801 08/22/22 0330  NA 135 135  K 4.7 4.0  CL 106 105  CO2 17* 25  GLUCOSE 196* 136*  BUN 11 14  CREATININE 1.11 0.84  CALCIUM 8.5* 8.1*  MG  --  1.8   Liver Function Tests: Recent Labs    08/21/22 1801  AST 40  ALT 23  ALKPHOS 63  BILITOT 1.6*  PROT 7.5  ALBUMIN 4.0   No results for input(s): "LIPASE", "AMYLASE" in the last 72 hours. CBC: Recent Labs    08/21/22 1801 08/22/22 0330  WBC 6.9 6.8  NEUTROABS 4.4  --   HGB 14.9 12.7*  HCT 47.7 40.7  MCV 80.8 80.9  PLT 150 178   Cardiac Enzymes: No results for input(s): "CKTOTAL", "CKMB", "CKMBINDEX", "TROPONINI" in the last 72 hours. BNP: Invalid input(s): "POCBNP" D-Dimer: No results for input(s): "DDIMER" in the last 72 hours. Hemoglobin A1C: Recent Labs    08/21/22 1801  HGBA1C 6.0*   Fasting Lipid Panel: Recent Labs    08/21/22 1801  CHOL 246*   HDL 43  LDLCALC 181*  TRIG 112  CHOLHDL 5.7   Thyroid Function Tests: No results for input(s): "TSH", "T4TOTAL", "T3FREE", "THYROIDAB" in the last 72 hours.  Invalid input(s): "FREET3" Anemia Panel: No results for input(s): "VITAMINB12", "FOLATE", "FERRITIN", "TIBC", "IRON", "RETICCTPCT" in the last 72 hours.  CARDIAC CATHETERIZATION  Result Date: 08/21/2022   Prox LAD to Mid LAD lesion is 50% stenosed.   Prox Cx lesion is 50% stenosed.   Mid LAD lesion is 50% stenosed.   Prox RCA to Mid RCA lesion is 40% stenosed.   Dist RCA lesion is 100% stenosed.   A stent was successfully placed using a STENT ONYX FRONTIER 2.25X30.   Post intervention, there is a 0% residual stenosis.   The left ventricular systolic function is normal.   The left ventricular ejection fraction is 50-55% by visual estimate. 1.  Acute inferior STEMI 2.  Occluded distal RCA 3.  Successful primary PCI with 2.5 x 30 mm Onyx frontier DES distal RCA 4.  Preserved left ventricular function with estimated LVEF 50-55% with mild inferior hypokinesis Recommendations 1.  Dual antiplatelet therapy uninterrupted x 1 year 2.  Aggrastat drip x 18 hours 3.  Start high intensity atorvastatin 80 mg daily 4.  Start metoprolol to tartrate 25 mg twice daily 5.  2D echocardiogram 6.  Start ACEI/ARB as blood pressure permits     Echo pending  TELEMETRY: Sinus rhythm:  ASSESSMENT AND PLAN:  Principal Problem:   STEMI involving oth coronary artery of inferior wall (HCC) Active Problems:   Anxiety disorder   Gastroesophageal reflux disease   HTN (hypertension)   Elevated blood sugar level    1. Acute inferior STEMI, successful primary PCI with 2.25 x 30 mm Onyx Frontier DES distal RCA.  Small caliber PL branch jailed by stent with restoration of flow after starting Aggrastat drip.  Left ventriculogram revealed preserved left ventricular function, LVEF 50-55%, with mild inferior hypokinesis. 2.  Essential hypertension, currently low BP    Recommendations   1.  Continue dual antiplatelet therapy uninterrupted x 1 year 2.  Continue Aggrastat x 18 hours, completed later this morning 3.  Continue high intensity atorvastatin 80 mg daily 4.  Continue metoprolol tartrate 25 mg twice daily 5.  Start ARB as blood pressure permits 5.  Review 2D echocardiogram 6.  Transfer to telemetry 7.  Probable discharge home in a.m.   Marcina Millard, MD, PhD, [facility] 08/22/2022 8:35 AM

## 2022-08-22 NOTE — Progress Notes (Signed)
PHARMACY CONSULT NOTE  Pharmacy Consult for Electrolyte Monitoring and Replacement   Recent Labs: Potassium (mmol/L)  Date Value  08/22/2022 4.0  12/16/2013 3.8   Magnesium (mg/dL)  Date Value  44/04/270 1.8   Calcium (mg/dL)  Date Value  53/66/4403 8.1 (L)   Calcium, Total (mg/dL)  Date Value  47/42/5956 8.8   Albumin (g/dL)  Date Value  38/75/6433 4.0  06/20/2019 4.4  12/16/2013 3.9   Sodium (mmol/L)  Date Value  08/22/2022 135  06/20/2019 139  12/16/2013 137    Assessment: 57 y.o. male with medical history significant of Anxiety, GERD, former smoker, ACE inhibitor intolerant for cough, p brought in by EMS with an inferior STEMI    Goal of Therapy:  Potassium 4.0 - 5.1 mmol/L Magnesium 2.0 - 2.4 mg/dL All Other Electrolytes WNL  Plan:  ---2 grams iv magnesium sulfate x 1 ----recheck electrolytes in am  Lowella Bandy ,PharmD Clinical Pharmacist 08/22/2022 7:47 AM

## 2022-08-22 NOTE — Progress Notes (Signed)
2000- Right radial noted with a hematoma, pressure held per policy and Dr. Darrold Junker notified, continue to hold pressure per policy.  0100- Site improving and TR band removed, covered with 2x2 gauze and tegaderm.  0630- Dressing clean dry and intact this morning, Hematoma improving with mild bruising noted.

## 2022-08-22 NOTE — Progress Notes (Signed)
       CROSS COVER NOTE  NAME: Alexander Greer MRN: [id number] DOB : [birth year]    HPI/Events of Note   Report:19 bear run of v tach  On review of chart:in by EMS with an inferior STEMI and taken immediately to the Cath Lab where he was found to have an occluded distal RCA undergoing stent placement with near resolution of chest pain.     Assessment and  Interventions   Assessment: Hemodynamically stable Plan: Informed RN to notify cardiology Check mag level       Donnie Mesa NP [facility] Cross Cover 7pm-7am - check amion for availability Pager [phone]

## 2022-08-22 NOTE — Progress Notes (Signed)
Patient and belongings transferred to room 237 via bed. Visitor at bedside with patient.

## 2022-08-22 NOTE — Hospital Course (Signed)
57 y.o. male with medical history significant of Anxiety, GERD, former smoker, ACE inhibitor intolerant for cough, p brought in by EMS with an inferior STEMI   5/11: taken immediately to the Cath Lab where he was found to have an occluded distal RCA undergoing stent placement with near resolution of chest pain. admitted to the hospitalist service s/p PCI   5/12: Transfer to PCU, possible discharge in a.m.

## 2022-08-23 ENCOUNTER — Telehealth (HOSPITAL_COMMUNITY): Payer: Self-pay | Admitting: Pharmacy Technician

## 2022-08-23 ENCOUNTER — Encounter: Payer: Self-pay | Admitting: Cardiology

## 2022-08-23 ENCOUNTER — Other Ambulatory Visit (HOSPITAL_COMMUNITY): Payer: Self-pay

## 2022-08-23 ENCOUNTER — Inpatient Hospital Stay (HOSPITAL_COMMUNITY)
Admit: 2022-08-23 | Discharge: 2022-08-23 | Disposition: A | Discharge status: Home / Self Care | Payer: No Typology Code available for payment source | Attending: Cardiology | Admitting: Cardiology

## 2022-08-23 DIAGNOSIS — I2119 ST elevation (STEMI) myocardial infarction involving other coronary artery of inferior wall: Secondary | ICD-10-CM | POA: Diagnosis not present

## 2022-08-23 DIAGNOSIS — F419 Anxiety disorder, unspecified: Secondary | ICD-10-CM

## 2022-08-23 DIAGNOSIS — I213 ST elevation (STEMI) myocardial infarction of unspecified site: Secondary | ICD-10-CM

## 2022-08-23 DIAGNOSIS — I10 Essential (primary) hypertension: Secondary | ICD-10-CM | POA: Diagnosis not present

## 2022-08-23 LAB — BASIC METABOLIC PANEL
Anion gap: 7 (ref 5–15)
BUN: 10 mg/dL (ref 6–20)
CO2: 24 mmol/L (ref 22–32)
Calcium: 8.2 mg/dL — ABNORMAL LOW (ref 8.9–10.3)
Chloride: 105 mmol/L (ref 98–111)
Creatinine, Ser: 0.91 mg/dL (ref 0.61–1.24)
GFR, Estimated: >60 mL/min (ref >60)
Glucose, Bld: 97 mg/dL (ref 70–99)
Potassium: 3.8 mmol/L (ref 3.5–5.1)
Sodium: 136 mmol/L (ref 135–145)

## 2022-08-23 LAB — LIPOPROTEIN A (LPA): Lipoprotein (a): 73.3 nmol/L — ABNORMAL HIGH (ref <75.0)

## 2022-08-23 LAB — ECHOCARDIOGRAM COMPLETE
AR max vel: 2.79 cm2
AV Area VTI: 2.68 cm2
AV Area mean vel: 2.44 cm2
AV Mean grad: 3 mmHg
AV Peak grad: 6.2 mmHg
Ao pk vel: 1.24 m/s
Area-P 1/2: 3.72 cm2
Calc EF: 63.2 %
Height: 73 in
MV VTI: 2.04 cm2
S' Lateral: 2.7 cm
Single Plane A2C EF: 61.8 %
Single Plane A4C EF: 63.4 %
Weight: 3234.59 oz

## 2022-08-23 LAB — MAGNESIUM: Magnesium: 2 mg/dL (ref 1.7–2.4)

## 2022-08-23 MED ORDER — FLUOXETINE HCL 20 MG PO CAPS: 20.0000 mg | ORAL_CAPSULE | Freq: Every day | ORAL | 0 refills | Status: AC

## 2022-08-23 MED ORDER — ATORVASTATIN CALCIUM 80 MG PO TABS: 80.0000 mg | ORAL_TABLET | Freq: Every day | ORAL | 11 refills | Status: AC

## 2022-08-23 MED ORDER — NITROGLYCERIN 0.4 MG SL SUBL: 0.4000 mg | SUBLINGUAL_TABLET | SUBLINGUAL | 12 refills | Status: AC | PRN

## 2022-08-23 MED ORDER — METOPROLOL TARTRATE 25 MG PO TABS: 25.0000 mg | ORAL_TABLET | Freq: Two times a day (BID) | ORAL | 11 refills | Status: AC

## 2022-08-23 MED ORDER — ASPIRIN 81 MG PO CHEW: 81.0000 mg | CHEWABLE_TABLET | Freq: Every day | ORAL | 11 refills | Status: AC

## 2022-08-23 MED ORDER — PRASUGREL HCL 10 MG PO TABS: 10.0000 mg | ORAL_TABLET | Freq: Every day | ORAL | 11 refills | Status: AC

## 2022-08-23 NOTE — Progress Notes (Signed)
  Transition of Care [facility]) Screening Note   Patient Details  Name: Alexander Greer Date of Birth: 08-13-1965   Transition of Care [facility]) CM/SW Contact:    Truddie Hidden, RN Phone Number: 08/23/2022, 10:07 AM    Transition of Care Department [facility]) has reviewed patient and no TOC needs have been identified at this time. We will continue to monitor patient advancement through interdisciplinary progression rounds. If new patient transition needs arise, please place a TOC consult.

## 2022-08-23 NOTE — Telephone Encounter (Signed)
Pharmacy Patient Advocate Encounter  Insurance verification completed.    The patient is insured through [employer]   The patient is currently admitted and ran test claims for the following: prasugrel (Effient .  Copays and coinsurance results were relayed to Inpatient clinical team.

## 2022-08-23 NOTE — TOC Benefit Eligibility Note (Signed)
Patient Product/process development scientist completed.    The patient is currently admitted and upon discharge could be taking prasugrel (Effient) 10 mg tablets.  The current 30 day co-pay is $5.00.   The patient is insured through [employer]   This test claim was processed through [employer]- copay amounts may vary at other pharmacies due to [employer], or as the patient moves through the different stages of their insurance plan.  Roland Earl, CPHT Pharmacy Patient Advocate Specialist [facility] Pharmacy Patient Advocate Team Direct Number: [phone]  Fax: [phone]

## 2022-08-23 NOTE — Discharge Summary (Signed)
Physician Discharge Summary   Patient: Alexander Greer MRN: [id number] DOB: [birth year]  Admit date:     08/21/2022  Discharge date: 08/23/22  Discharge Physician: Delfino Lovett   PCP: Pcp, No   Recommendations at discharge:    F/up with outpt providers as requested  Discharge Diagnoses: Principal Problem:   STEMI involving oth coronary artery of inferior wall (HCC) Active Problems:   Anxiety disorder   Gastroesophageal reflux disease   HTN (hypertension)   Elevated blood sugar level  Hospital Course: 57 y.o. male with medical history significant of Anxiety, GERD, former smoker, ACE inhibitor intolerant for cough, p brought in by EMS with an inferior STEMI   5/11: taken immediately to the Cath Lab where he was found to have an occluded distal RCA undergoing stent placement with near resolution of chest pain. admitted to the hospitalist service s/p PCI   5/12: Transfer to PCU, possible discharge in a.m.  Assessment and Plan: * STEMI involving oth coronary artery of inferior wall (HCC) Acute inferior STEMI - Continue dual antiplatelet therapy uninterrupted x 1 year with aspirin 81mg  daily and prasugrel 10mg  daily  - S/p Aggrastat x 18 hours - Continue high intensity atorvastatin 80 mg daily - Continue metoprolol tartrate 25 mg twice daily - Consider ARB as blood pressure permits on an outpatient basis - 2D echocardiogram with preserved LVEF, no RWMA's - Cardio have Discussed cardiac rehab, lifting and work restrictions, and new medications in detail with the patient and have provided work excuse - outpt follow-up with Dr. Darrold Junker in 1 to 2 weeks.  Elevated blood sugar level A1c 6 Outpatient follow-up with PCP for further evaluation  HTN (hypertension) History of ACE inhibitor allergy-cough Continue metoprolol  Gastroesophageal reflux disease Continue Protonix  Anxiety disorder          Consultants: Cardio Procedures performed: Cath  Disposition: Home Diet  recommendation:  Discharge Diet Orders (From admission, onward)     Start     Ordered   08/23/22 0000  Diet - low sodium heart healthy        08/23/22 0920           Carb modified diet DISCHARGE MEDICATION: Allergies as of 08/23/2022       Reactions   Ace Inhibitors Cough   Penicillins         Medication List     STOP taking these medications    Aloe Vera Moisturizing 99.5 % Gel Generic drug: Aloe Vera   fluconazole 150 MG tablet Commonly known as: DIFLUCAN   hydrOXYzine 25 MG tablet Commonly known as: ATARAX   pantoprazole 40 MG tablet Commonly known as: PROTONIX   tadalafil 5 MG tablet Commonly known as: CIALIS   valACYclovir 1000 MG tablet Commonly known as: VALTREX   Vitamin D (Ergocalciferol) 1.25 MG (50000 UNIT) Caps capsule Commonly known as: DRISDOL       TAKE these medications    aspirin 81 MG chewable tablet Chew 1 tablet (81 mg total) by mouth daily.   atorvastatin 80 MG tablet Commonly known as: LIPITOR Take 1 tablet (80 mg total) by mouth daily.   FLUoxetine 20 MG capsule Commonly known as: PROzac Take 1 capsule (20 mg total) by mouth daily. What changed:  medication strength how much to take when to take this   lisinopril 5 MG tablet Commonly known as: ZESTRIL Take 1 tablet (5 mg total) by mouth daily.   metoprolol tartrate 25 MG tablet Commonly known as: LOPRESSOR Take 1 tablet (  25 mg total) by mouth 2 (two) times daily.   nitroGLYCERIN 0.4 MG SL tablet Commonly known as: NITROSTAT Place 1 tablet (0.4 mg total) under the tongue every 5 (five) minutes x 3 doses as needed for chest pain.   prasugrel 10 MG Tabs tablet Commonly known as: EFFIENT Take 1 tablet (10 mg total) by mouth daily.        Follow-up Information     Paraschos, Alexander, MD. Go in 1 week(s).   Specialty: Cardiology Why: Appointment on Tuesday, 08/31/2022 at 11:00am. Contact information: [address] [facility] [place] [address] [phone]                Discharge Exam: Ceasar Mons Weights   08/21/22 1753 08/21/22 1947  Weight: 93 kg 59.3 kg   57 year old male lying in the bed comfortably without any acute distress Lungs clear to auscultation bilaterally Heart regular rate and rhythm Abdomen soft, benign Neuro alert and awake, nonfocal Skin no rash or lesion Psych normal mood and affect  Condition at discharge: good  The results of significant diagnostics from this hospitalization (including imaging, microbiology, ancillary and laboratory) are listed below for reference.   Imaging Studies: ECHOCARDIOGRAM COMPLETE  Result Date: 08/23/2022    ECHOCARDIOGRAM REPORT   Patient Name:   Alexander Greer [place] Date of Exam: 08/23/2022 Medical Rec #:  [id number]        Height:       73.0 in Accession #:    [id number]       Weight:       202.2 lb Date of Birth:  03/03/1966        BSA:          2.161 m Patient Age:    57 years         BP:           100/71 mmHg Patient Gender: M                HR:           62 bpm. Exam Location:  [facility] Procedure: 2D Echo, Cardiac Doppler and Color Doppler Indications:     Acute MI  History:         Patient has no prior history of Echocardiogram examinations.                  Acute MI; Risk Factors:Hypertension.  Sonographer:     Mikki Harbor Referring Phys:  [id number] ALEXANDER PARASCHOS Diagnosing Phys: Lorine Bears MD  Sonographer Comments: Global longitudinal strain was attempted. IMPRESSIONS  1. Left ventricular ejection fraction, by estimation, is 55 to 60%. The left ventricle has normal function. The left ventricle has no regional wall motion abnormalities. There is mild left ventricular hypertrophy. Left ventricular diastolic parameters were normal. The average left ventricular global longitudinal strain is -17.3 %. The global longitudinal strain is normal.  2. Right ventricular systolic function is normal. The right ventricular size is normal.  Tricuspid regurgitation signal is inadequate for assessing PA pressure.  3. The mitral valve is normal in structure. Trivial mitral valve regurgitation. No evidence of mitral stenosis.  4. The aortic valve is normal in structure. Aortic valve regurgitation is not visualized. No aortic stenosis is present. FINDINGS  Left Ventricle: Left ventricular ejection fraction, by estimation, is 55 to 60%. The left ventricle has normal function. The left ventricle has no regional wall motion abnormalities. The average left ventricular global longitudinal strain is -17.3 %. The global  longitudinal strain is normal. The left ventricular internal cavity size was normal in size. There is mild left ventricular hypertrophy. Left ventricular diastolic parameters were normal. Right Ventricle: The right ventricular size is normal. No increase in right ventricular wall thickness. Right ventricular systolic function is normal. Tricuspid regurgitation signal is inadequate for assessing PA pressure. Left Atrium: Left atrial size was normal in size. Right Atrium: Right atrial size was normal in size. Pericardium: There is no evidence of pericardial effusion. Mitral Valve: The mitral valve is normal in structure. Trivial mitral valve regurgitation. No evidence of mitral valve stenosis. MV peak gradient, 4.8 mmHg. The mean mitral valve gradient is 2.0 mmHg. Tricuspid Valve: The tricuspid valve is normal in structure. Tricuspid valve regurgitation is not demonstrated. No evidence of tricuspid stenosis. Aortic Valve: The aortic valve is normal in structure. Aortic valve regurgitation is not visualized. No aortic stenosis is present. Aortic valve mean gradient measures 3.0 mmHg. Aortic valve peak gradient measures 6.2 mmHg. Aortic valve area, by VTI measures 2.68 cm. Pulmonic Valve: The pulmonic valve was normal in structure. Pulmonic valve regurgitation is mild. No evidence of pulmonic stenosis. Aorta: The aortic root is normal in size and  structure. Venous: The inferior vena cava was not well visualized. IAS/Shunts: No atrial level shunt detected by color flow Doppler.  LEFT VENTRICLE PLAX 2D LVIDd:         4.40 cm     Diastology LVIDs:         2.70 cm     LV e' medial:    9.14 cm/s LV PW:         1.10 cm     LV E/e' medial:  9.9 LV IVS:        0.90 cm     LV e' lateral:   10.20 cm/s LVOT diam:     2.00 cm     LV E/e' lateral: 8.9 LV SV:         70 LV SV Index:   32          2D Longitudinal Strain LVOT Area:     3.14 cm    2D Strain GLS Avg:     -17.3 %  LV Volumes (MOD) LV vol d, MOD A2C: 57.3 ml LV vol d, MOD A4C: 69.2 ml LV vol s, MOD A2C: 21.9 ml LV vol s, MOD A4C: 25.3 ml LV SV MOD A2C:     35.4 ml LV SV MOD A4C:     69.2 ml LV SV MOD BP:      40.6 ml RIGHT VENTRICLE RV Basal diam:  3.55 cm RV Mid diam:    3.80 cm RV S prime:     11.00 cm/s TAPSE (M-mode): 2.4 cm LEFT ATRIUM             Index        RIGHT ATRIUM           Index LA diam:        3.30 cm 1.53 cm/m   RA Area:     17.40 cm LA Vol (A2C):   40.2 ml 18.60 ml/m  RA Volume:   46.40 ml  21.47 ml/m LA Vol (A4C):   37.3 ml 17.26 ml/m LA Biplane Vol: 38.7 ml 17.90 ml/m  AORTIC VALVE                    PULMONIC VALVE AV Area (Vmax):    2.79 cm     PV  Vmax:       0.87 m/s AV Area (Vmean):   2.44 cm     PV Peak grad:  3.0 mmHg AV Area (VTI):     2.68 cm AV Vmax:           124.00 cm/s AV Vmean:          70.300 cm/s AV VTI:            0.260 m AV Peak Grad:      6.2 mmHg AV Mean Grad:      3.0 mmHg LVOT Vmax:         110.00 cm/s LVOT Vmean:        54.600 cm/s LVOT VTI:          0.222 m LVOT/AV VTI ratio: 0.85  AORTA Ao Root diam: 2.80 cm MITRAL VALVE MV Area (PHT): 3.72 cm    SHUNTS MV Area VTI:   2.04 cm    Systemic VTI:  0.22 m MV Peak grad:  4.8 mmHg    Systemic Diam: 2.00 cm MV Mean grad:  2.0 mmHg MV Vmax:       1.09 m/s MV Vmean:      58.0 cm/s MV Decel Time: 204 msec MV E velocity: 90.70 cm/s MV A velocity: 84.90 cm/s MV E/A ratio:  1.07 Lorine Bears MD Electronically signed by  Lorine Bears MD Signature Date/Time: 08/23/2022/9:57:19 AM    Final    CARDIAC CATHETERIZATION  Result Date: 08/23/2022   Prox LAD to Mid LAD lesion is 50% stenosed.   Prox Cx lesion is 50% stenosed.   Mid LAD lesion is 50% stenosed.   Prox RCA to Mid RCA lesion is 40% stenosed.   Dist RCA lesion is 100% stenosed.   A stent was successfully placed using a STENT ONYX FRONTIER 2.25X30.   Post intervention, there is a 0% residual stenosis.   The left ventricular systolic function is normal.   The left ventricular ejection fraction is 50-55% by visual estimate. 1.  Acute inferior STEMI 2.  Occluded distal RCA 3.  Successful primary PCI with 2.5 x 30 mm Onyx frontier DES distal RCA 4.  Preserved left ventricular function with estimated LVEF 50-55% with mild inferior hypokinesis Recommendations 1.  Dual antiplatelet therapy uninterrupted x 1 year 2.  Aggrastat drip x 18 hours 3.  Start high intensity atorvastatin 80 mg daily 4.  Start metoprolol to tartrate 25 mg twice daily 5.  2D echocardiogram 6.  Start ACEI/ARB as blood pressure permits    Microbiology: Results for orders placed or performed during the hospital encounter of 08/21/22  MRSA Next Gen by PCR, Nasal     Status: None   Collection Time: 08/21/22  8:44 PM   Specimen: Nasal Mucosa; Nasal Swab  Result Value Ref Range Status   MRSA by PCR Next Gen NOT DETECTED NOT DETECTED Final    Comment: (NOTE) The GeneXpert MRSA Assay (FDA approved for NASAL specimens only), is one component of a comprehensive MRSA colonization surveillance program. It is not intended to diagnose MRSA infection nor to guide or monitor treatment for MRSA infections. Test performance is not FDA approved in patients less than 80 years old. Performed at [facility], [address] [place], [place] [address]     Labs: CBC: Recent Labs  Lab 08/21/22 1801 08/22/22 0330  WBC 6.9 6.8  NEUTROABS 4.4  --   HGB 14.9 12.7*  HCT 47.7 40.7  MCV 80.8 80.9  PLT  150 178   Basic Metabolic Panel: Recent Labs  Lab 08/21/22 1801 08/22/22 0330 08/23/22 0447  NA 135 135 136  K 4.7 4.0 3.8  CL 106 105 105  CO2 17* 25 24  GLUCOSE 196* 136* 97  BUN 11 14 10   CREATININE 1.11 0.84 0.91  CALCIUM 8.5* 8.1* 8.2*  MG  --  1.8 2.0   Liver Function Tests: Recent Labs  Lab 08/21/22 1801  AST 40  ALT 23  ALKPHOS 63  BILITOT 1.6*  PROT 7.5  ALBUMIN 4.0   CBG: Recent Labs  Lab 08/21/22 1949  GLUCAP 144*    Discharge time spent: greater than 30 minutes.  Signed: Delfino Lovett, MD [facility] 08/23/2022

## 2022-08-23 NOTE — Progress Notes (Signed)
PHARMACY CONSULT NOTE  Pharmacy Consult for Electrolyte Monitoring and Replacement   Recent Labs: Potassium (mmol/L)  Date Value  08/23/2022 3.8  12/16/2013 3.8   Magnesium (mg/dL)  Date Value  57/84/6962 2.0   Calcium (mg/dL)  Date Value  95/28/4132 8.2 (L)   Calcium, Total (mg/dL)  Date Value  44/04/270 8.8   Albumin (g/dL)  Date Value  53/66/4403 4.0  06/20/2019 4.4  12/16/2013 3.9   Sodium (mmol/L)  Date Value  08/23/2022 136  06/20/2019 139  12/16/2013 137    Assessment: 57 y.o. male with medical history significant of Anxiety, GERD, former smoker, ACE inhibitor intolerant for cough, p brought in by EMS with an inferior STEMI    Goal of Therapy:  Potassium 4.0 - 5.1 mmol/L Magnesium 2.0 - 2.4 mg/dL All Other Electrolytes WNL  Plan:  NO replacement needed. Pharmacy will sign off as pt is transfer out of the ICU. Please re-consult if needed.   Ronnald Ramp ,PharmD Clinical Pharmacist 08/23/2022 7:24 AM

## 2022-08-23 NOTE — Progress Notes (Signed)
*  PRELIMINARY RESULTS* Echocardiogram 2D Echocardiogram has been performed.  Carolyne Fiscal 08/23/2022, 9:07 AM

## 2022-08-23 NOTE — Progress Notes (Signed)
[facility]  Patient ID: Alexander Greer MRN: [id number] DOB/AGE: August 10, 1965 57 y.o.   Admit date: 08/21/2022 Referring Physician Roxan Hockey Primary Physician  Primary Cardiologist  Reason for Consultation inferior STEMI   HPI: 57 year old gentleman referred for evaluation of inferior STEMI.  Patient has history of essential hypertension no longer on medications.  He was in his usual state of health until today when he experienced substernal chest pain x 2 hours.  EMS was called and initial EKG was diagnostic for inferior STEMI.  Patient was brought to the cardiac catheterization laboratory which revealed occluded distal RCA.  The patient underwent primary PCI with 2.25 x 30 mm Onyx Frontier DES distal RCA, with near resolution of chest pain.  Small caliber PL branch was jailed by stent, with initial occlusion, and restoration of flow after running Aggrastat drip.  Interval History:  -Remains chest pain-free today -Ambulatory in the room without difficulty -Right wrist with mild soreness and trace swelling without overt bleeding or oozing -Eager to go home  Social: Works as a Merchandiser, retail for [employer]   Vitals:   08/22/22 2030 08/22/22 2124 08/22/22 2341 08/23/22 0356  BP: 126/75 113/71 101/66 100/71  Pulse: 63 65 60 62  Resp: (!) 22 20 16 14   Temp:  98.6 F (37 C) 98.9 F (37.2 C) 98.7 F (37.1 C)  TempSrc:  Oral    SpO2: 98% 100% 98% 96%  Weight:      Height:         Intake/Output Summary (Last 24 hours) at 08/23/2022 8657 Last data filed at 08/22/2022 2126 Gross per 24 hour  Intake 474.24 ml  Output 1250 ml  Net -775.76 ml       PHYSICAL EXAM  General: Pleasant middle-aged black male, sitting upright in hospital bed. HEENT:  Normocephalic and atramatic Neck:  No JVD.  Lungs: Normal respiratory effort on room air, clear to auscultation bilaterally Heart: HRRR . Normal S1 and S2 without gallops or murmurs.  Abdomen: Nondistended appearing Msk:  Normal strength  and tone for age. Extremities: No clubbing, cyanosis or edema.   Neuro: Alert and oriented X 3. Psych:  Good affect, responds appropriately   LABS: Basic Metabolic Panel: Recent Labs    08/22/22 0330 08/23/22 0447  NA 135 136  K 4.0 3.8  CL 105 105  CO2 25 24  GLUCOSE 136* 97  BUN 14 10  CREATININE 0.84 0.91  CALCIUM 8.1* 8.2*  MG 1.8 2.0    Liver Function Tests: Recent Labs    08/21/22 1801  AST 40  ALT 23  ALKPHOS 63  BILITOT 1.6*  PROT 7.5  ALBUMIN 4.0    No results for input(s): "LIPASE", "AMYLASE" in the last 72 hours. CBC: Recent Labs    08/21/22 1801 08/22/22 0330  WBC 6.9 6.8  NEUTROABS 4.4  --   HGB 14.9 12.7*  HCT 47.7 40.7  MCV 80.8 80.9  PLT 150 178    Cardiac Enzymes: No results for input(s): "CKTOTAL", "CKMB", "CKMBINDEX", "TROPONINI" in the last 72 hours. BNP: Invalid input(s): "POCBNP" D-Dimer: No results for input(s): "DDIMER" in the last 72 hours. Hemoglobin A1C: Recent Labs    08/21/22 1801  HGBA1C 6.0*    Fasting Lipid Panel: Recent Labs    08/21/22 1801  CHOL 246*  HDL 43  LDLCALC 181*  TRIG 112  CHOLHDL 5.7    Thyroid Function Tests: No results for input(s): "TSH", "T4TOTAL", "T3FREE", "THYROIDAB" in the last 72 hours.  Invalid input(s): "FREET3"  Anemia Panel: No results for input(s): "VITAMINB12", "FOLATE", "FERRITIN", "TIBC", "IRON", "RETICCTPCT" in the last 72 hours.  CARDIAC CATHETERIZATION  Result Date: 08/23/2022   Prox LAD to Mid LAD lesion is 50% stenosed.   Prox Cx lesion is 50% stenosed.   Mid LAD lesion is 50% stenosed.   Prox RCA to Mid RCA lesion is 40% stenosed.   Dist RCA lesion is 100% stenosed.   A stent was successfully placed using a STENT ONYX FRONTIER 2.25X30.   Post intervention, there is a 0% residual stenosis.   The left ventricular systolic function is normal.   The left ventricular ejection fraction is 50-55% by visual estimate. 1.  Acute inferior STEMI 2.  Occluded distal RCA 3.   Successful primary PCI with 2.5 x 30 mm Onyx frontier DES distal RCA 4.  Preserved left ventricular function with estimated LVEF 50-55% with mild inferior hypokinesis Recommendations 1.  Dual antiplatelet therapy uninterrupted x 1 year 2.  Aggrastat drip x 18 hours 3.  Start high intensity atorvastatin 80 mg daily 4.  Start metoprolol to tartrate 25 mg twice daily 5.  2D echocardiogram 6.  Start ACEI/ARB as blood pressure permits     Echo pending  TELEMETRY: Sinus rhythm:  ASSESSMENT AND PLAN:  Principal Problem:   STEMI involving oth coronary artery of inferior wall (HCC) Active Problems:   Anxiety disorder   Gastroesophageal reflux disease   HTN (hypertension)   Elevated blood sugar level    1. Acute inferior STEMI, successful primary PCI with 2.25 x 30 mm Onyx Frontier DES distal RCA.  Small caliber PL branch jailed by stent with restoration of flow after starting Aggrastat drip.  Left ventriculogram revealed preserved left ventricular function, LVEF 50-55%, with mild inferior hypokinesis. 2.  Essential hypertension, currently low BP   Recommendations   1.  Continue dual antiplatelet therapy uninterrupted x 1 year with aspirin 81mg  daily and prasugrel 10mg  daily  2.  S/p Aggrastat x 18 hours 3.  Continue high intensity atorvastatin 80 mg daily 4.  Continue metoprolol tartrate 25 mg twice daily 5.  Start ARB as blood pressure permits on an outpatient basis 5.  2D echocardiogram with preserved LVEF, no RWMA's 6.  Discussed cardiac rehab, lifting and work restrictions, and new medications in detail with the patient 7.  Okay for discharge from a cardiac perspective this morning.  Will arrange for follow-up with Dr. Darrold Junker in 1 to 2 weeks.  This patient's plan of care was discussed and created with Dr. Darrold Junker and he is in agreement.    Rebeca Allegra, PA-C 08/23/2022 8:38 AM
